# Patient Record
Sex: Female | Born: 1965 | Race: Black or African American | Hispanic: No | Marital: Single | Smoking: Never smoker
Health system: Southern US, Community
[De-identification: ages and names within clinical notes are randomized; demographics above are authoritative.]

## PROBLEM LIST (undated history)

## (undated) DIAGNOSIS — D219 Benign neoplasm of connective and other soft tissue, unspecified: Secondary | ICD-10-CM

## (undated) DIAGNOSIS — E7800 Pure hypercholesterolemia, unspecified: Secondary | ICD-10-CM

## (undated) DIAGNOSIS — Z87442 Personal history of urinary calculi: Secondary | ICD-10-CM

## (undated) DIAGNOSIS — Z8719 Personal history of other diseases of the digestive system: Secondary | ICD-10-CM

## (undated) DIAGNOSIS — E119 Type 2 diabetes mellitus without complications: Secondary | ICD-10-CM

## (undated) DIAGNOSIS — Q632 Ectopic kidney: Secondary | ICD-10-CM

## (undated) DIAGNOSIS — D259 Leiomyoma of uterus, unspecified: Secondary | ICD-10-CM

## (undated) DIAGNOSIS — E785 Hyperlipidemia, unspecified: Secondary | ICD-10-CM

## (undated) DIAGNOSIS — E669 Obesity, unspecified: Secondary | ICD-10-CM

## (undated) DIAGNOSIS — K829 Disease of gallbladder, unspecified: Secondary | ICD-10-CM

## (undated) DIAGNOSIS — N200 Calculus of kidney: Secondary | ICD-10-CM

## (undated) DIAGNOSIS — I10 Essential (primary) hypertension: Secondary | ICD-10-CM

## (undated) DIAGNOSIS — K5730 Diverticulosis of large intestine without perforation or abscess without bleeding: Secondary | ICD-10-CM

## (undated) DIAGNOSIS — D649 Anemia, unspecified: Secondary | ICD-10-CM

## (undated) HISTORY — DX: Disease of gallbladder, unspecified: K82.9

## (undated) HISTORY — DX: Obesity, unspecified: E66.9

## (undated) HISTORY — DX: Benign neoplasm of connective and other soft tissue, unspecified: D21.9

## (undated) HISTORY — DX: Anemia, unspecified: D64.9

## (undated) HISTORY — DX: Pure hypercholesterolemia, unspecified: E78.00

## (undated) HISTORY — DX: Ectopic kidney: Q63.2

## (undated) HISTORY — PX: CHOLECYSTECTOMY: SHX55

## (undated) HISTORY — PX: KNEE SURGERY: SHX244

## (undated) HISTORY — DX: Essential (primary) hypertension: I10

## (undated) HISTORY — DX: Type 2 diabetes mellitus without complications: E11.9

## (undated) HISTORY — PX: HAND SURGERY: SHX662

---

## 1997-12-28 ENCOUNTER — Other Ambulatory Visit: Admission: RE | Admit: 1997-12-28 | Discharge: 1997-12-28 | Payer: Self-pay | Admitting: Gynecology

## 1999-07-25 ENCOUNTER — Other Ambulatory Visit: Admission: RE | Admit: 1999-07-25 | Discharge: 1999-07-25 | Payer: Self-pay | Admitting: Gynecology

## 2000-06-30 ENCOUNTER — Encounter: Admission: RE | Admit: 2000-06-30 | Discharge: 2000-06-30 | Payer: Self-pay | Admitting: *Deleted

## 2000-06-30 ENCOUNTER — Encounter: Payer: Self-pay | Admitting: *Deleted

## 2000-07-29 ENCOUNTER — Encounter (INDEPENDENT_AMBULATORY_CARE_PROVIDER_SITE_OTHER): Payer: Self-pay | Admitting: Specialist

## 2000-07-29 ENCOUNTER — Observation Stay (HOSPITAL_COMMUNITY): Admission: RE | Admit: 2000-07-29 | Discharge: 2000-07-30 | Payer: Self-pay | Admitting: General Surgery

## 2000-08-29 ENCOUNTER — Other Ambulatory Visit: Admission: RE | Admit: 2000-08-29 | Discharge: 2000-08-29 | Payer: Self-pay | Admitting: Gynecology

## 2001-04-04 ENCOUNTER — Encounter: Payer: Self-pay | Admitting: Emergency Medicine

## 2001-04-04 ENCOUNTER — Emergency Department (HOSPITAL_COMMUNITY): Admission: EM | Admit: 2001-04-04 | Discharge: 2001-04-04 | Payer: Self-pay | Admitting: Emergency Medicine

## 2001-05-28 ENCOUNTER — Encounter: Payer: Self-pay | Admitting: Orthopedic Surgery

## 2001-05-28 ENCOUNTER — Ambulatory Visit (HOSPITAL_COMMUNITY): Admission: RE | Admit: 2001-05-28 | Discharge: 2001-05-28 | Payer: Self-pay | Admitting: Orthopedic Surgery

## 2001-08-31 ENCOUNTER — Other Ambulatory Visit: Admission: RE | Admit: 2001-08-31 | Discharge: 2001-08-31 | Payer: Self-pay | Admitting: Gynecology

## 2002-02-17 ENCOUNTER — Ambulatory Visit (HOSPITAL_BASED_OUTPATIENT_CLINIC_OR_DEPARTMENT_OTHER): Admission: RE | Admit: 2002-02-17 | Discharge: 2002-02-17 | Payer: Self-pay | Admitting: Orthopedic Surgery

## 2003-03-02 ENCOUNTER — Other Ambulatory Visit: Admission: RE | Admit: 2003-03-02 | Discharge: 2003-03-02 | Payer: Self-pay | Admitting: Gynecology

## 2003-03-03 ENCOUNTER — Encounter: Admission: RE | Admit: 2003-03-03 | Discharge: 2003-03-03 | Payer: Self-pay | Admitting: Gynecology

## 2004-01-27 ENCOUNTER — Emergency Department (HOSPITAL_COMMUNITY): Admission: EM | Admit: 2004-01-27 | Discharge: 2004-01-27 | Payer: Self-pay | Admitting: Family Medicine

## 2004-03-09 ENCOUNTER — Other Ambulatory Visit: Admission: RE | Admit: 2004-03-09 | Discharge: 2004-03-09 | Payer: Self-pay | Admitting: Gynecology

## 2004-03-28 ENCOUNTER — Encounter: Admission: RE | Admit: 2004-03-28 | Discharge: 2004-03-28 | Payer: Self-pay | Admitting: Gynecology

## 2005-03-14 ENCOUNTER — Other Ambulatory Visit: Admission: RE | Admit: 2005-03-14 | Discharge: 2005-03-14 | Payer: Self-pay | Admitting: Gynecology

## 2005-03-29 ENCOUNTER — Encounter: Admission: RE | Admit: 2005-03-29 | Discharge: 2005-03-29 | Payer: Self-pay | Admitting: Gynecology

## 2006-01-06 HISTORY — PX: INTRAUTERINE DEVICE INSERTION: SHX323

## 2006-03-19 ENCOUNTER — Other Ambulatory Visit: Admission: RE | Admit: 2006-03-19 | Discharge: 2006-03-19 | Payer: Self-pay | Admitting: Gynecology

## 2006-04-01 ENCOUNTER — Encounter: Admission: RE | Admit: 2006-04-01 | Discharge: 2006-04-01 | Payer: Self-pay | Admitting: Gynecology

## 2007-03-23 ENCOUNTER — Other Ambulatory Visit: Admission: RE | Admit: 2007-03-23 | Discharge: 2007-03-23 | Payer: Self-pay | Admitting: Gynecology

## 2008-01-08 ENCOUNTER — Ambulatory Visit (HOSPITAL_COMMUNITY): Admission: RE | Admit: 2008-01-08 | Discharge: 2008-01-08 | Payer: Self-pay | Admitting: Surgery

## 2008-01-13 ENCOUNTER — Ambulatory Visit (HOSPITAL_COMMUNITY): Admission: RE | Admit: 2008-01-13 | Discharge: 2008-01-13 | Payer: Self-pay | Admitting: Surgery

## 2008-01-27 ENCOUNTER — Encounter: Admission: RE | Admit: 2008-01-27 | Discharge: 2008-01-27 | Payer: Self-pay | Admitting: Surgery

## 2008-01-29 ENCOUNTER — Ambulatory Visit: Payer: Self-pay | Admitting: Women's Health

## 2008-03-23 ENCOUNTER — Encounter: Payer: Self-pay | Admitting: Women's Health

## 2008-03-23 ENCOUNTER — Other Ambulatory Visit: Admission: RE | Admit: 2008-03-23 | Discharge: 2008-03-23 | Payer: Self-pay | Admitting: Gynecology

## 2008-03-23 ENCOUNTER — Ambulatory Visit: Payer: Self-pay | Admitting: Women's Health

## 2008-04-07 ENCOUNTER — Ambulatory Visit (HOSPITAL_COMMUNITY): Admission: RE | Admit: 2008-04-07 | Discharge: 2008-04-07 | Payer: Self-pay | Admitting: Surgery

## 2008-10-27 ENCOUNTER — Ambulatory Visit: Payer: Self-pay | Admitting: Women's Health

## 2009-01-11 ENCOUNTER — Ambulatory Visit: Payer: Self-pay | Admitting: Women's Health

## 2009-02-07 ENCOUNTER — Emergency Department (HOSPITAL_COMMUNITY): Admission: EM | Admit: 2009-02-07 | Discharge: 2009-02-07 | Payer: Self-pay | Admitting: Family Medicine

## 2009-03-30 ENCOUNTER — Ambulatory Visit: Payer: Self-pay | Admitting: Women's Health

## 2009-03-30 ENCOUNTER — Other Ambulatory Visit: Admission: RE | Admit: 2009-03-30 | Discharge: 2009-03-30 | Payer: Self-pay | Admitting: Gynecology

## 2009-10-16 ENCOUNTER — Ambulatory Visit: Payer: Self-pay | Admitting: Women's Health

## 2010-02-01 ENCOUNTER — Ambulatory Visit: Payer: Self-pay | Admitting: Women's Health

## 2010-04-04 ENCOUNTER — Other Ambulatory Visit
Admission: RE | Admit: 2010-04-04 | Discharge: 2010-04-04 | Payer: Self-pay | Source: Home / Self Care | Admitting: Gynecology

## 2010-04-04 ENCOUNTER — Ambulatory Visit: Payer: Self-pay | Admitting: Women's Health

## 2010-05-20 ENCOUNTER — Encounter: Payer: Self-pay | Admitting: Obstetrics and Gynecology

## 2010-09-14 NOTE — Op Note (Signed)
[facility]  Patient:    Cassidy Giles, Cassidy Giles                      MRN: [id number] Proc. Date: 07/29/00 Adm. Date:  [id number] Attending:  Carson Myrtle CC:         Roosvelt Harps, M.D.  Luanna Cole. Lenord Fellers, M.D.   Operative Report  PREOPERATIVE DIAGNOSIS:  Chronic cholecystolithiasis.  POSTOPERATIVE DIAGNOSIS:  Chronic cholecystolithiasis.  PROCEDURE:  Laparoscopic cholecystectomy.  SURGEON:  Kendrick Ranch, M.D.  ASSISTANT:  Thornton Dales, M.D.  ANESTHESIA:  CRNA supervised ______.  HISTORY OF PRESENT ILLNESS:  Ms. Halbur is considerably overweight, 34, otherwise healthy. Gallstones on ultrasound, normal liver function studies, and she wishes to proceed with the lap chole as planned.  DESCRIPTION OF PROCEDURE:  The patient brought to the operating room, placed supine, general endotracheal anesthesia administered. A Foley catheter inserted, the abdomen scrubbed, prepped and draped in the usual fashion. Marcaine 0.5% with epinephrine was used throughout prior to each puncture. A vertical infraumbilical incision made, the fascia identified, opened vertically, the peritoneum entered without complication. A Hasson catheter placed, abdomen insufflated, camera introduced, general peritoneoscopy was fairly unremarkable. The uterus appeared fibroid. The gallbladder appeared chronically inflamed with multiple stones. A second 10 mm trocar placed in the mid epigastrium, two 5 mm trocars in the right upper quadrant. Appropriate instruments applied, the gallbladder grasped, placed on tension, dissection at the base of the gallbladder revealed a normal cystic duct entering the gallbladder. This was dissected free, triply clipped and divided. The artery behind it was triply clipped and divided and the gallbladder was removed from the gallbladder bed without incident or complication. There was no spillage. No bowel or blood. The bed was dry, irrigant was clear.  The gallbladder was removed from the abdomen through the infraumbilical incision by crushing the stones and removing the stones and gallbladder. That incision was tied closed with a #1 Vicryl. Irrigation carried out and was again clear. All irrigant, CO2, instruments and trocars removed. The skin incisions were closed with 3-0 monocryl and Steri-Strips were applied. Second and first counts were correct. She tolerated it well and was awakened and taken to the recovery room. DD:  07/29/00 TD:  07/29/00 Job: [id number]

## 2010-09-14 NOTE — Op Note (Signed)
NAME:  Cassidy Giles, WAMBOLT NO.:  [id number]   MEDICAL RECORD NO.:  [id number]                   PATIENT TYPE:  AMB   LOCATION:  DSC                                  FACILITY:  [facility]   PHYSICIAN:  Mila Homer. Sherlean Foot, M.D.              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   PROCEDURE:  Right knee arthroscopy with saucerization of the lateral discoid  meniscus and partial lateral meniscectomy as well as plica debridement in  the patellofemoral joint.   SURGEON:  Mila Homer. Sherlean Foot, M.D.   ANESTHESIA:  General.   INDICATIONS FOR PROCEDURE:  The patient is a 45 year old black female with  mechanical symptoms in her knee.  Informed consent was obtained.   DESCRIPTION OF PROCEDURE:  The patient was laid supine, administered general  LMA anesthesia.  The right lower extremity was prepped and draped in the  usual sterile fashion.  Inferolateral and inferomedial portals were created  with a #11 blade, blunt trocar, and cannula.  Diagnostic arthroscopy  revealed a large patellofemoral plica which was impinging between the  patella and femur.  There was no chondromalacia in the patellofemoral  compartment.  There was just a very small area of grade 3-4 chondromalacia  on the medial femoral condyle, otherwise the medial compartment was normal.  The ACL and PCL were normal.  Lateral compartment showed a peripheral  discoid meniscus.  I put a basket forceps into the inferomedial compartment  and performed a saucerization of the lateral meniscus.  The tear, however,  was radial and went all the way from the posterior horn to the anterior horn  and involved the entire popliteus hiatus area.  I,  therefore, had to remove  the entire meniscus, except for the far posterior horn and anterior horn  which was anterior to the popliteus hiatus.  The lateral compartment had no  chondromalacia.  I used a [employer] to remove all of the meniscus  debris, and then took one further tour.  Finally, I went into extension and  debrided the patellofemoral plica with the [employer] shaver and Arthro-  Care cautery wand.  I then lavaged the knee joint, closed with interrupted 4-  0 nylon sutures, infiltrated with 10 cc of a Marcaine/morphine mixture in  both portals, and dressed with Xeroform dressing sponges, sterile Webril,  and Ace wrap.  The patient tolerated the procedure well.   COMPLICATIONS:  None.   DRAINS:  None.   TOURNIQUET:  None.  Mila Homer. Sherlean Foot, M.D.   SDL/MEDQ  D:  02/17/2002  T:  02/17/2002  Job:  [id number]

## 2010-11-27 DIAGNOSIS — I10 Essential (primary) hypertension: Secondary | ICD-10-CM | POA: Insufficient documentation

## 2011-01-07 ENCOUNTER — Telehealth: Payer: Self-pay | Admitting: *Deleted

## 2011-01-07 ENCOUNTER — Other Ambulatory Visit: Payer: Self-pay | Admitting: *Deleted

## 2011-01-07 DIAGNOSIS — Z3049 Encounter for surveillance of other contraceptives: Secondary | ICD-10-CM

## 2011-01-07 MED ORDER — LEVONORGESTREL 20 MCG/24HR IU IUD: INTRAUTERINE_SYSTEM | Freq: Once | INTRAUTERINE | Status: DC

## 2011-01-07 NOTE — Telephone Encounter (Signed)
Patient informed Mirena IUD benefits.  Patient would have a $30 copay.  Informed would need TF to insert. Patient will call back to schedule.

## 2011-02-26 ENCOUNTER — Encounter: Payer: Self-pay | Admitting: *Deleted

## 2011-02-26 ENCOUNTER — Other Ambulatory Visit: Payer: Self-pay | Admitting: *Deleted

## 2011-02-26 NOTE — Progress Notes (Signed)
  Patient states that she wants to make sure that when she has IUD put in there is a numbing med used on cervix. Will have a $30 copay to remove and insert.

## 2011-03-11 ENCOUNTER — Encounter: Payer: Self-pay | Admitting: Gynecology

## 2011-03-11 ENCOUNTER — Ambulatory Visit (INDEPENDENT_AMBULATORY_CARE_PROVIDER_SITE_OTHER): Payer: BC Managed Care – PPO | Admitting: Gynecology

## 2011-03-11 ENCOUNTER — Encounter: Payer: Self-pay | Admitting: Anesthesiology

## 2011-03-11 VITALS — BP 138/88

## 2011-03-11 DIAGNOSIS — D219 Benign neoplasm of connective and other soft tissue, unspecified: Secondary | ICD-10-CM

## 2011-03-11 DIAGNOSIS — IMO0001 Reserved for inherently not codable concepts without codable children: Secondary | ICD-10-CM

## 2011-03-11 DIAGNOSIS — Z975 Presence of (intrauterine) contraceptive device: Secondary | ICD-10-CM

## 2011-03-11 DIAGNOSIS — D259 Leiomyoma of uterus, unspecified: Secondary | ICD-10-CM

## 2011-03-11 DIAGNOSIS — Z3049 Encounter for surveillance of other contraceptives: Secondary | ICD-10-CM

## 2011-03-11 NOTE — Progress Notes (Signed)
45 year old who presented to the office today to remove her expired Mirena IUD that was placed in September 2007 with replacement of a new Mirena IUD. Patient has done well with this form of contraception she is fully aware this 99% effective and is good for 5 years. Review of her record indicated she did for annual exam next month. Also it was noted that an ultrasound done in the office 01/11/2009 indicated she has a uterus that measured 11 x 4.8 x 3.9 cm with a fundal pedunculated fibroid measured 45 x 33 mm and several small fibroids as well. Consent form was signed.  Procedure: The vagina was cleansed with Betadine solution but prior to this a bimanual examination demonstrated an anteverted uterus irregularity at the fundus was noted as a result of the fundal fibroid. A single-tooth tenaculum and then placed on the anterior cervical lip the cervix required some dilatation and the uterus sounded to 7 cm. Prior to this field IUD was removed shown to the patient and discarded. The new sterile IUD was placed without any complications. The string was trimmed.  Plan: Patient will return back in one month for overdue annual exam will take the opportunity doing an ultrasound to followup on her fibroid uterus and the same time to confirm that the IUD is in the right position as well. Patient was given an Aleve after the procedure tolerated well without complication we'll follow accordingly.

## 2011-04-04 ENCOUNTER — Encounter: Payer: Self-pay | Admitting: Women's Health

## 2011-04-08 ENCOUNTER — Other Ambulatory Visit (HOSPITAL_COMMUNITY)
Admission: RE | Admit: 2011-04-08 | Discharge: 2011-04-08 | Disposition: A | Discharge status: Home / Self Care | Payer: BC Managed Care – PPO | Source: Ambulatory Visit | Attending: Gynecology | Admitting: Gynecology

## 2011-04-08 ENCOUNTER — Ambulatory Visit (INDEPENDENT_AMBULATORY_CARE_PROVIDER_SITE_OTHER): Payer: BC Managed Care – PPO

## 2011-04-08 ENCOUNTER — Ambulatory Visit (INDEPENDENT_AMBULATORY_CARE_PROVIDER_SITE_OTHER): Payer: BC Managed Care – PPO | Admitting: Gynecology

## 2011-04-08 ENCOUNTER — Encounter: Payer: Self-pay | Admitting: Gynecology

## 2011-04-08 ENCOUNTER — Encounter: Payer: Self-pay | Admitting: Women's Health

## 2011-04-08 ENCOUNTER — Other Ambulatory Visit: Payer: Self-pay | Admitting: Gynecology

## 2011-04-08 VITALS — BP 136/88 | Ht 63.25 in | Wt 233.0 lb

## 2011-04-08 DIAGNOSIS — Z01419 Encounter for gynecological examination (general) (routine) without abnormal findings: Secondary | ICD-10-CM | POA: Insufficient documentation

## 2011-04-08 DIAGNOSIS — N83209 Unspecified ovarian cyst, unspecified side: Secondary | ICD-10-CM

## 2011-04-08 DIAGNOSIS — Z803 Family history of malignant neoplasm of breast: Secondary | ICD-10-CM

## 2011-04-08 DIAGNOSIS — D219 Benign neoplasm of connective and other soft tissue, unspecified: Secondary | ICD-10-CM

## 2011-04-08 DIAGNOSIS — N831 Corpus luteum cyst of ovary, unspecified side: Secondary | ICD-10-CM

## 2011-04-08 DIAGNOSIS — IMO0001 Reserved for inherently not codable concepts without codable children: Secondary | ICD-10-CM

## 2011-04-08 DIAGNOSIS — R1904 Left lower quadrant abdominal swelling, mass and lump: Secondary | ICD-10-CM

## 2011-04-08 DIAGNOSIS — R635 Abnormal weight gain: Secondary | ICD-10-CM

## 2011-04-08 DIAGNOSIS — E1169 Type 2 diabetes mellitus with other specified complication: Secondary | ICD-10-CM | POA: Insufficient documentation

## 2011-04-08 DIAGNOSIS — R82998 Other abnormal findings in urine: Secondary | ICD-10-CM

## 2011-04-08 DIAGNOSIS — Q638 Other specified congenital malformations of kidney: Secondary | ICD-10-CM

## 2011-04-08 DIAGNOSIS — D259 Leiomyoma of uterus, unspecified: Secondary | ICD-10-CM

## 2011-04-08 DIAGNOSIS — Z975 Presence of (intrauterine) contraceptive device: Secondary | ICD-10-CM

## 2011-04-08 DIAGNOSIS — B373 Candidiasis of vulva and vagina: Secondary | ICD-10-CM

## 2011-04-08 DIAGNOSIS — E119 Type 2 diabetes mellitus without complications: Secondary | ICD-10-CM

## 2011-04-08 DIAGNOSIS — B3731 Acute candidiasis of vulva and vagina: Secondary | ICD-10-CM

## 2011-04-08 DIAGNOSIS — Q632 Ectopic kidney: Secondary | ICD-10-CM | POA: Insufficient documentation

## 2011-04-08 MED ORDER — FLUCONAZOLE 150 MG PO TABS: 150.0000 mg | ORAL_TABLET | Freq: Once | ORAL | Status: AC

## 2011-04-08 NOTE — Patient Instructions (Signed)
Patient information: Uterine artery embolization (The Basics)Written by the doctors and editors at UpToDate  What is uterine artery embolization? - Uterine artery embolization (also called "Colombia") is a treatment for fibroids. Fibroids are abnormal growths of tissue that form in the uterus, the part of the woman's body that holds the baby if she is pregnant.  Colombia is also called "uterine fibroid embolization." It works by blocking the blood vessels that supply blood to fibroids, causing them to shrink. It is 1 of several ways doctors treat fibroids.  Why might a woman get Colombia? - Colombia helps ease the heavy bleeding and lower belly pressure caused by fibroids. Compared with surgery to treat fibroids, Colombia: Takes less time  Does not require any cuts (incisions) in the belly  Has a shorter recovery time What are the downsides of Colombia? - Compared with women treated with surgery, women treated with Colombia are more likely to need a second surgery to control heavy bleeding.  Colombia does not work well on certain types of fibroids. Also, women who want to get pregnant in the future should not have Colombia. Your doctor can help you decide if Colombia is right for you.  What happens during Colombia? - If you are having Colombia, you will be treated in a hospital. You will get medicines (called "anesthesia") to block pain in the lower half of your body and to make you feel relaxed. But you will stay awake.  The doctor will then insert a thin tube (called a "catheter") into the artery at the top of 1 leg and advance it up to your uterus. With the tube in place, the doctor will inject a special dye that can be seen on X-rays to show the arteries that bring blood to the fibroid. Next, the doctor will inject tiny plastic beads into the artery to block the blood flow to the fibroid. Afterward, the doctor will repeat the same steps on the other side. The procedure takes about 1 hour. You will likely stay in the hospital overnight, where nurses can check  you and give you pain medicines.  What happens after Colombia? - Most women have lower belly pain and cramping after the procedure. These symptoms are usually worst the first 2 days after the procedure and can last up to 7 days. Some women have discharge or bleeding from the vagina for 2 weeks after Colombia. Sometimes, the bleeding lasts for months.  Some women also:  Feel sick to their stomach (nausea)  Throw up (vomit)  Have a fever  Feel tired and achy If you have these symptoms, your doctor can prescribe pain relievers and other medicines to treat them.  You should see your doctor for several follow-up visits during the first year to check that the procedure worked.  What if I want to get pregnant? - It is possible to get pregnant after Colombia. But after Colombia, pregnancy might not be safe for either the mother or the baby. Because of this, doctors advise women who want to get pregnant NOT to have this procedure. They also advise women to use birth control after the procedure to avoid pregnancy.

## 2011-04-08 NOTE — Progress Notes (Signed)
Cassidy Giles [birth year] [id number]   History:    45 y.o.  gravida 0 para 0 presented to the office today for her annual gynecological examination. Patient had a Mirena IUD placed in November 2011 and has had no problems. She frequently does her self breast examination. Her sister was recently diagnosed breast cancer. Patient's mammogram recently done this year which was normal. Patient has had a past history of type 2 diabetes currently on no medication.  Past medical history,surgical history, family history and social history were all reviewed and documented in the EPIC chart.  Gynecologic History Patient's last menstrual period was 02/25/2011. Contraception: IUD Last Pap: 2011. Results were:normal} Last mammogram: 2012. Results were: normal  Obstetric History OB History    Grav Para Term Preterm Abortions TAB SAB Ect Mult Living   0                ROS:  Was performed and pertinent positives and negatives are included in the history.  Exam: chaperone present  BP 136/88  Ht 5' 3.25" (1.607 m)  Wt 233 lb (105.688 kg)  BMI 40.95 kg/m2  LMP 02/25/2011  Body mass index is 40.95 kg/(m^2).  General appearance : Well developed well nourished female. No acute distress HEENT: Neck supple, trachea midline, no carotid bruits, no thyroidmegaly Lungs: Clear to auscultation, no rhonchi or wheezes, or rib retractions  Heart: Regular rate and rhythm, no murmurs or gallops Breast:Examined in sitting and supine position were symmetrical in appearance, no palpable masses or tenderness,  no skin retraction, no nipple inversion, no nipple discharge, no skin discoloration, no axillary or supraclavicular lymphadenopathy Abdomen: no palpable masses or tenderness, no rebound or guarding Extremities: no edema or skin discoloration or tenderness  Pelvic:  Bartholin, Urethra, Skene Glands: Within normal limits             Vagina: No gross lesions or discharge  Cervix: No gross lesions or discharge  IUD string seen  Uterus  anteverted slightly irregular at the fundus, normal size, shape and consistency, non-tender and mobile  Adnexa  Without masses or tenderness  Anus and perineum  normal   Rectovaginal  normal sphincter tone without palpated masses or tenderness             Hemoccult not done  Patient overweight with a weight of 233 pounds and a BMI of 40.95. Ultrasound was done today for followup on her fibroid uterus. Ultrasound demonstrated uterus measured 8.6 x 5.2 x 3.9 cm endometrial stripe 0.9 mm a subserosal myoma measuring 5.2 x 5.1 x 4.3 cm was noted IUD was seen in the intrauterine cavity. 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    Assessment/Plan:  45 y.o. female for annual exam with the above ultrasound findings. Patient sometimes having some: Served fullness sensation the by Vernona Rieger she's been any major problems. We discussed this the cyst fibroid was transmural it to give her problem we can plan on doing a hysterectomy since she is 45 years of age area she is not interested in hysterectomy. He did discuss uterine artery embolization and literature formation was provided and we'll refer her to the interventional radiologist for further evaluation and assessment and she would like to proceed that route. During her examination she was noted to have a white thick vaginal  discharge for was a wet prep was done which demonstrated moniliasis and she was given a prescription of Diflucan 150 mg to take today. The following labs were drawn: Fasting blood sugar, fasting lipid profile, CBC, TSH, CBC, urinalysis and Pap smear. We discussed about importance of her checking with her sister and her sister's oncologist to see if she was tested for the BRCA one BRCA2 gene. I've asked to return  back in 3 months for followup ultrasound to make sure the cyst is resolved.   Ok Edwards MD, 1:58 PM 04/08/2011

## 2011-04-09 ENCOUNTER — Other Ambulatory Visit: Payer: Self-pay | Admitting: Gynecology

## 2011-04-09 LAB — GLUCOSE, RANDOM: Glucose, Bld: 115 mg/dL — ABNORMAL HIGH (ref 70–99)

## 2011-04-10 ENCOUNTER — Other Ambulatory Visit: Payer: Self-pay | Admitting: *Deleted

## 2011-04-10 DIAGNOSIS — D219 Benign neoplasm of connective and other soft tissue, unspecified: Secondary | ICD-10-CM

## 2011-05-07 ENCOUNTER — Telehealth: Payer: Self-pay | Admitting: *Deleted

## 2011-05-07 NOTE — Telephone Encounter (Signed)
Message copied by Libby Maw on Tue May 07, 2011 10:48 AM ------      Message from: Reynaldo Minium H      Created: Mon Apr 08, 2011  2:06 PM       Cassidy Giles, this patient is interested in uterine artery embolization for fibroid uterus. Please make arrangements for her with the interventional radiologist here in [place]. Please make them aware that she does have an IUD in place. Patient expecting your call at a later time thank you.

## 2011-05-07 NOTE — Telephone Encounter (Signed)
appt set up for 05/21/11 @7am  with Dr. Deanne Coffer.  Patient was contacted by their office.

## 2011-05-21 ENCOUNTER — Ambulatory Visit
Admission: RE | Admit: 2011-05-21 | Discharge: 2011-05-21 | Disposition: A | Discharge status: Home / Self Care | Payer: BC Managed Care – PPO | Source: Ambulatory Visit | Attending: Gynecology | Admitting: Gynecology

## 2011-05-21 ENCOUNTER — Other Ambulatory Visit: Payer: Self-pay | Admitting: Gynecology

## 2011-05-21 DIAGNOSIS — D219 Benign neoplasm of connective and other soft tissue, unspecified: Secondary | ICD-10-CM

## 2011-05-29 ENCOUNTER — Telehealth: Payer: Self-pay | Admitting: *Deleted

## 2011-05-29 NOTE — Telephone Encounter (Signed)
Lm for Neysa Bonito to let her know precert approved for MRI #[id number] till Jun 27, 2011.

## 2011-06-03 ENCOUNTER — Other Ambulatory Visit: Payer: BC Managed Care – PPO

## 2011-07-08 ENCOUNTER — Ambulatory Visit: Payer: BC Managed Care – PPO | Admitting: Gynecology

## 2011-07-08 ENCOUNTER — Other Ambulatory Visit: Payer: BC Managed Care – PPO

## 2011-12-06 ENCOUNTER — Ambulatory Visit (INDEPENDENT_AMBULATORY_CARE_PROVIDER_SITE_OTHER): Payer: BC Managed Care – PPO | Admitting: Women's Health

## 2011-12-06 ENCOUNTER — Encounter: Payer: Self-pay | Admitting: Women's Health

## 2011-12-06 DIAGNOSIS — Z833 Family history of diabetes mellitus: Secondary | ICD-10-CM

## 2011-12-06 DIAGNOSIS — Z30431 Encounter for routine checking of intrauterine contraceptive device: Secondary | ICD-10-CM

## 2011-12-06 DIAGNOSIS — Z975 Presence of (intrauterine) contraceptive device: Secondary | ICD-10-CM

## 2011-12-06 DIAGNOSIS — IMO0001 Reserved for inherently not codable concepts without codable children: Secondary | ICD-10-CM

## 2011-12-06 LAB — HEMOGLOBIN A1C
Hgb A1c MFr Bld: 6.3 % — ABNORMAL HIGH (ref <5.7)
Mean Plasma Glucose: 134 mg/dL — ABNORMAL HIGH (ref <117)

## 2011-12-06 NOTE — Progress Notes (Signed)
Patient ID: Cassidy Giles, female   DOB: 27-Jul-1965, 46 y.o.   MRN: [id number] Presents to have Mirena IUD removed. Placed November 2012, has had spotting/bleeding most days of the month with increased discharge, abdominal cramping and increased dysmenorrhea with cycle. History of a 5 cm fibroid. Not sexually active for greater than a year. Denies any vaginal discharge with itching or odor at this time. Has had an elevated glucose in the past, had seen Dr. in [facility] in the past, on no meds, but she has moved. Glucose was 112 in December 2012.  Exam: External genitalia within normal limits, speculum exam cervix is pink IUD strings visible, IUD removed intact shown to patient and discarded.  Fibroids Elevated blood sugars  Plan: Condoms encouraged if become sexually active, reviewed hysterectomy. Is 46 reviewed menopause. Will call if dysmenorrhea increases with no relief with Motrin. Reviewed importance of increasing exercise, decreasing calories for weight loss. Hemoglobin A1c pending. Reviewed if elevated will refer to endocrinologist.

## 2011-12-06 NOTE — Patient Instructions (Addendum)

## 2011-12-09 ENCOUNTER — Telehealth: Payer: Self-pay | Admitting: *Deleted

## 2011-12-09 NOTE — Telephone Encounter (Signed)
Office note faxed to [facility] office, they will contact office with time and date.  [phone] contact #

## 2011-12-09 NOTE — Telephone Encounter (Signed)
Pt informed with the below note. 

## 2011-12-09 NOTE — Telephone Encounter (Signed)
Pt appointment set at [facility] @ 01/14/12 @ 9:00am. Left message for pt to call.

## 2012-02-19 ENCOUNTER — Ambulatory Visit (INDEPENDENT_AMBULATORY_CARE_PROVIDER_SITE_OTHER): Payer: BC Managed Care – PPO | Admitting: Women's Health

## 2012-02-19 ENCOUNTER — Encounter: Payer: Self-pay | Admitting: Women's Health

## 2012-02-19 DIAGNOSIS — B373 Candidiasis of vulva and vagina: Secondary | ICD-10-CM

## 2012-02-19 DIAGNOSIS — Z23 Encounter for immunization: Secondary | ICD-10-CM

## 2012-02-19 LAB — WET PREP FOR TRICH, YEAST, CLUE: Trich, Wet Prep: NONE SEEN

## 2012-02-19 MED ORDER — FLUCONAZOLE 150 MG PO TABS: 150.0000 mg | ORAL_TABLET | Freq: Once | ORAL | Status: DC

## 2012-02-19 NOTE — Patient Instructions (Addendum)
Bartholin's Cyst and Abscess  Bartholin's glands produce mucus through small openings just outside the opening of the vagina. The mucus helps with lubrication around the vagina during sexual intercourse. If the duct becomes clogged, the gland will swell and cause a bulge on the inside of the vagina. If this becomes big enough, it can be seen and felt on the outside of the vagina as well. Sometimes, the swelling will shrink away by itself. However, if the cyst becomes infected, the Bartholin's cyst fills with pus and becomes more swollen, red and painful and becomes a Bartholin's abscess. This usually requires antibiotic treatment and surgical drainage. Sometimes, with minor surgery under local anesthesia, a small tube is placed in the cyst or abscess wall. This allows continued drainage for up to 6 weeks. Minor surgery can make a new opening to replace the clogged duct and help prevent future cysts or abscess.  If the abscess occurs several times, a minor operation with local anesthesia is necessary to remove the Bartholin's gland completely or to make it drain better. Cutting open the gland and suturing the edges to make the opening of the gland bigger (marsupialization) may be needed and should usually be done by your obstetrician-gyncology physician. Antibiotics are usually prescribed for this condition. Take all antibiotics as prescribed. Make sure to finish them even if you are doing better. Take warm sitz baths for 20 minutes, 3 times a day. See your caregiver for follow-up care as recommended.  SEEK MEDICAL CARE IF:   · You have increasing pain, swelling, or redness near the vagina.  · You have vomiting or inability to tolerate medicines.  · You have a fever.  · You have uncontrolled bleeding from the vagina.  Document Released: 05/23/2004 Document Revised: 07/08/2011 Document Reviewed: 05/26/2009  ExitCare® Patient Information ©2013 [employer].

## 2012-02-19 NOTE — Progress Notes (Signed)
Patient ID: Cassidy Giles, female   DOB: [birth year], 46 y.o.   MRN: [id number] Presents with complaint of vaginal itching and bulging nontender knot at left-side of vagina. Has had in the past that resolved on its own. Had been on antibiotic for upper respiratory and sinus infection 3 weeks ago. Not sexually active. Denies any urinary symptoms.  Exam: External genitalia 3 cm left Bartholin cyst nontender, soft, non indurated. Speculum exam scant discharge, vaginal walls slightly erythematous, wet prep positive for yeast. Bimanual no CMT or adnexal fullness or tenderness.  Yeast Left Bartholin  Cyst.   Plan: Diflucan 150 by mouth x1 dose, prescription proper use given. Did review most likely from antibiotics. Reviewed importance of wearing loose clothes, instructed to call if Bartholin cyst becomes painful, or does not resolve.

## 2012-02-19 NOTE — Addendum Note (Signed)
Addended by: Harrington Challenger on: 02/19/2012 04:24 PM   Modules accepted: Orders

## 2012-02-19 NOTE — Addendum Note (Signed)
Addended by: Harrington Challenger on: 02/19/2012 05:31 PM   Modules accepted: Level of Service

## 2012-02-20 DIAGNOSIS — Z23 Encounter for immunization: Secondary | ICD-10-CM

## 2012-02-20 NOTE — Addendum Note (Signed)
Addended by: Leonard Schwartz A on: 02/20/2012 11:02 AM   Modules accepted: Orders

## 2012-04-09 ENCOUNTER — Encounter: Payer: Self-pay | Admitting: Women's Health

## 2012-04-09 ENCOUNTER — Ambulatory Visit (INDEPENDENT_AMBULATORY_CARE_PROVIDER_SITE_OTHER): Payer: BC Managed Care – PPO | Admitting: Women's Health

## 2012-04-09 VITALS — BP 138/80 | Ht 63.5 in | Wt 244.0 lb

## 2012-04-09 DIAGNOSIS — Z01419 Encounter for gynecological examination (general) (routine) without abnormal findings: Secondary | ICD-10-CM

## 2012-04-09 DIAGNOSIS — B373 Candidiasis of vulva and vagina: Secondary | ICD-10-CM

## 2012-04-09 DIAGNOSIS — J45909 Unspecified asthma, uncomplicated: Secondary | ICD-10-CM

## 2012-04-09 DIAGNOSIS — Z803 Family history of malignant neoplasm of breast: Secondary | ICD-10-CM

## 2012-04-09 LAB — WET PREP FOR TRICH, YEAST, CLUE

## 2012-04-09 MED ORDER — FLUCONAZOLE 150 MG PO TABS: 150.0000 mg | ORAL_TABLET | Freq: Once | ORAL | Status: DC

## 2012-04-09 NOTE — Patient Instructions (Addendum)
1500 Calorie Diabetic Diet The 1500 calorie diabetic diet limits calories to 1500 each day. Following this diet and making healthy meal choices can help improve overall health. It controls blood glucose (sugar) levels and can also help lower blood pressure and cholesterol.  SERVING SIZES Measuring foods and serving sizes helps to make sure you are getting the right amount of food. The list below tells how big or small some common serving sizes are.  1 oz.........4 stacked dice.  3 oz.........Deck of cards.  1 tsp........Tip of little finger.  1 tbs........Thumb.  2 tbs........Golf ball.   cup.......Half of a fist.  1 cup........A fist. GUIDELINES FOR CHOOSING FOODS The goal of this diet is to eat a variety of foods and limit calories to 1500 each day. This can be done by choosing foods that are low in calories and fat. The diet also suggests eating small amounts of food frequently. Doing this helps control your blood glucose levels, so they do not get too high or too low. Each meal or snack may include a protein food source to help you feel more satisfied. Try to eat about the same amount of food around the same time each day. This includes weekend days, travel days, and days off work. Space your meals about 4 to 5 hours apart, and add a snack between them, if you wish.  For example, a daily food plan could include breakfast, a morning snack, lunch, dinner, and an evening snack. Healthy meals and snacks have different types of foods, including whole grains, vegetables, fruits, lean meats, poultry, fish, and dairy products. As you plan your meals, select a variety of foods. Choose from the bread and starch, vegetable, fruit, dairy, and meat/protein groups. Examples of foods from each group are listed below, with their suggested serving sizes. Use measuring cups and spoons to become familiar with what a healthy portion looks like. Bread and Starch Each serving equals 15 grams of  carbohydrate.  1 slice bread.   bagel.   cup cold cereal (unsweetened).   cup hot cereal or mashed potatoes.  1 small potato (size of a computer mouse).   cup cooked pasta or rice.   English muffin.  1 cup broth-based soup.  3 cups of popcorn.  4 to 6 whole-wheat crackers.   cup cooked beans, peas, or corn. Vegetables Each serving equals 5 grams of carbohydrate.   cup cooked vegetables.  1 cup raw vegetables.   cup tomato or vegetable juice. Fruit Each serving equals 15 grams of carbohydrate.  1 small apple or orange.  1  cup watermelon or strawberries.   cup applesauce (no sugar added).  2 tbs raisins.   banana.   cup canned fruit, packed in water or in its own juice.   cup unsweetened fruit juice. Dairy Each serving equals 12 to 15 grams of carbohydrate.  1 cup fat-free milk.  6 oz artificially sweetened yogurt or plain yogurt.  1 cup low-fat buttermilk.  1 cup soy milk.  1 cup almond milk. Meat/Protein  1 large egg.  2 to 3 oz meat, poultry, or fish.   cup low-fat cottage cheese.  1 tbs peanut butter.  1 oz low-fat cheese.   cup tuna, packed in water.   cup tofu. Fat  1 tsp oil.  1 tsp trans-fat-free margarine.  1 tsp butter.  1 tsp mayonnaise.  2 tbs avocado.  1 tbs salad dressing.  1 tbs cream cheese.  2 tbs sour cream. SAMPLE 1500 CALORIE DIET   PLAN Breakfast   whole-wheat English muffin (1 carb serving).  1 tsp trans-fat-free margarine.  1 scrambled egg.  1 cup fat-free milk (1 carb serving).  1 small orange (1 carb serving). Lunch  Chicken wrap.  1 whole-wheat tortilla, 8-inch (1 carb servings).  2 oz chicken breast, sliced.  2 tbs low-fat salad dressing, such as [place].   cup shredded lettuce.  2 slices tomato.   cup carrot sticks.  1 small apple (1 carb serving). Afternoon Snack  3 graham cracker squares (1 carb serving).  1 tbs peanut butter. Dinner  2 oz lean  pork chop, broiled.  1 cup brown rice (3 carb servings).   cup steamed carrots.   cup green beans.  1 cup fat-free milk (1 carb serving).  1 tsp trans-fat-free margarine. Evening Snack   cup low-fat cottage cheese.  1 small peach or pear, sliced (or  cup canned in water) (1 carb serving). MEAL PLAN You can use this worksheet to help you make a daily meal plan based on the 1500 calorie diabetic diet suggestions. If you are using this plan to help you control your blood glucose, you may interchange carbohydrate containing foods (dairy, starches, and fruits). Select a variety of fresh foods of varying colors and flavors. The total amount of carbohydrate in your meals or snacks is more important than making sure you include all of the food groups every time you eat. You can choose from approximately this many of the following foods to build your day's meals:  6 Starches.  3 Vegetables.  2 Fruits.  2 Dairy.  4 to 6 oz Meat/Protein.  Up to 3 Fats. Your dietician can use this worksheet to help you decide how many servings and which types of foods are right for you. BREAKFAST Food Group and Servings / Food Choice Starch _________________________________________________________ Dairy __________________________________________________________ Fruit ___________________________________________________________ Meat/Protein____________________________________________________ Fat ____________________________________________________________ LUNCH Food Group and Servings / Food Choice  Starch _________________________________________________________ Meat/Protein ___________________________________________________ Vegetables _____________________________________________________ Fruit __________________________________________________________ Dairy __________________________________________________________ Fat ____________________________________________________________ Cassidy Giles Food Group and Servings / Food Choice Dairy __________________________________________________________ Starch _________________________________________________________ Meat/Protein____________________________________________________ Cassidy Giles ___________________________________________________________ Cassidy Giles Food Group and Servings / Food Choice Starch _________________________________________________________ Meat/Protein ___________________________________________________ Dairy __________________________________________________________ Vegetable ______________________________________________________ Fruit ___________________________________________________________ Fat ____________________________________________________________ Cassidy Giles Food Group and Servings / Food Choice Fruit ___________________________________________________________ Meat/Protein ____________________________________________________ Dairy __________________________________________________________ Starch __________________________________________________________ DAILY TOTALS Starches _________________________ Vegetables _______________________ Fruits ____________________________ Dairy ____________________________ Meat/Protein_____________________ Fats _____________________________ Document Released: 11/05/2004 Document Revised: 07/08/2011 Document Reviewed: 03/02/2009 [employer] Patient Information 2013 [place], [place]. Health Maintenance, Females A healthy lifestyle and preventative care can promote health and wellness.  Maintain regular health, dental, and eye exams.  Eat a healthy diet. Foods like vegetables, fruits, whole grains, low-fat dairy products, and lean protein foods contain the nutrients you need without too many calories. Decrease your intake of foods high in solid fats, added sugars, and salt. Get information about a proper diet from your caregiver, if necessary.  Regular physical exercise  is one of the most important things you can do for your health. Most adults should get at least 150 minutes of moderate-intensity exercise (any activity that increases your heart rate and causes you to sweat) each week. In addition, most adults need muscle-strengthening exercises on 2 or more days a week.   Maintain a healthy weight. The body mass index (BMI) is a screening tool to identify possible weight problems. It provides an estimate of body fat based on height and weight. Your caregiver can help determine your BMI, and can help you achieve  or maintain a healthy weight. For adults 20 years and older:  A BMI below 18.5 is considered underweight.  A BMI of 18.5 to 24.9 is normal.  A BMI of 25 to 29.9 is considered overweight.  A BMI of 30 and above is considered obese.  Maintain normal blood lipids and cholesterol by exercising and minimizing your intake of saturated fat. Eat a balanced diet with plenty of fruits and vegetables. Blood tests for lipids and cholesterol should begin at age 75 and be repeated every 5 years. If your lipid or cholesterol levels are high, you are over 50, or you are a high risk for heart disease, you may need your cholesterol levels checked more frequently.Ongoing high lipid and cholesterol levels should be treated with medicines if diet and exercise are not effective.  If you smoke, find out from your caregiver how to quit. If you do not use tobacco, do not start.  If you are pregnant, do not drink alcohol. If you are breastfeeding, be very cautious about drinking alcohol. If you are not pregnant and choose to drink alcohol, do not exceed 1 drink per day. One drink is considered to be 12 ounces (355 mL) of beer, 5 ounces (148 mL) of wine, or 1.5 ounces (44 mL) of liquor.  Avoid use of street drugs. Do not share needles with anyone. Ask for help if you need support or instructions about stopping the use of drugs.  High blood pressure causes heart disease and  increases the risk of stroke. Blood pressure should be checked at least every 1 to 2 years. Ongoing high blood pressure should be treated with medicines, if weight loss and exercise are not effective.  If you are 82 to 48 years old, ask your caregiver if you should take aspirin to prevent strokes.  Diabetes screening involves taking a blood sample to check your fasting blood sugar level. This should be done once every 3 years, after age 69, if you are within normal weight and without risk factors for diabetes. Testing should be considered at a younger age or be carried out more frequently if you are overweight and have at least 1 risk factor for diabetes.  Breast cancer screening is essential preventative care for women. You should practice "breast self-awareness." This means understanding the normal appearance and feel of your breasts and may include breast self-examination. Any changes detected, no matter how small, should be reported to a caregiver. Women in their 65s and 30s should have a clinical breast exam (CBE) by a caregiver as part of a regular health exam every 1 to 3 years. After age 36, women should have a CBE every year. Starting at age 61, women should consider having a mammogram (breast X-ray) every year. Women who have a family history of breast cancer should talk to their caregiver about genetic screening. Women at a high risk of breast cancer should talk to their caregiver about having an MRI and a mammogram every year.  The Pap test is a screening test for cervical cancer. Women should have a Pap test starting at age 22. Between ages 34 and 35, Pap tests should be repeated every 2 years. Beginning at age 68, you should have a Pap test every 3 years as long as the past 3 Pap tests have been normal. If you had a hysterectomy for a problem that was not cancer or a condition that could lead to cancer, then you no longer need Pap tests. If you are between ages  65 and 70, and you have had  normal Pap tests going back 10 years, you no longer need Pap tests. If you have had past treatment for cervical cancer or a condition that could lead to cancer, you need Pap tests and screening for cancer for at least 20 years after your treatment. If Pap tests have been discontinued, risk factors (such as a new sexual partner) need to be reassessed to determine if screening should be resumed. Some women have medical problems that increase the chance of getting cervical cancer. In these cases, your caregiver may recommend more frequent screening and Pap tests.  The human papillomavirus (HPV) test is an additional test that may be used for cervical cancer screening. The HPV test looks for the virus that can cause the cell changes on the cervix. The cells collected during the Pap test can be tested for HPV. The HPV test could be used to screen women aged 33 years and older, and should be used in women of any age who have unclear Pap test results. After the age of 71, women should have HPV testing at the same frequency as a Pap test.  Colorectal cancer can be detected and often prevented. Most routine colorectal cancer screening begins at the age of 24 and continues through age 1. However, your caregiver may recommend screening at an earlier age if you have risk factors for colon cancer. On a yearly basis, your caregiver may provide home test kits to check for hidden blood in the stool. Use of a small camera at the end of a tube, to directly examine the colon (sigmoidoscopy or colonoscopy), can detect the earliest forms of colorectal cancer. Talk to your caregiver about this at age 22, when routine screening begins. Direct examination of the colon should be repeated every 5 to 10 years through age 61, unless early forms of pre-cancerous polyps or small growths are found.  Hepatitis C blood testing is recommended for all people born from [birth year] through 1967 and any individual with known risks for hepatitis  C.  Practice safe sex. Use condoms and avoid high-risk sexual practices to reduce the spread of sexually transmitted infections (STIs). Sexually active women aged 14 and younger should be checked for Chlamydia, which is a common sexually transmitted infection. Older women with new or multiple partners should also be tested for Chlamydia. Testing for other STIs is recommended if you are sexually active and at increased risk.  Osteoporosis is a disease in which the bones lose minerals and strength with aging. This can result in serious bone fractures. The risk of osteoporosis can be identified using a bone density scan. Women ages 11 and over and women at risk for fractures or osteoporosis should discuss screening with their caregivers. Ask your caregiver whether you should be taking a calcium supplement or vitamin D to reduce the rate of osteoporosis.  Menopause can be associated with physical symptoms and risks. Hormone replacement therapy is available to decrease symptoms and risks. You should talk to your caregiver about whether hormone replacement therapy is right for you.  Use sunscreen with a sun protection factor (SPF) of 30 or greater. Apply sunscreen liberally and repeatedly throughout the day. You should seek shade when your shadow is shorter than you. Protect yourself by wearing long sleeves, pants, a wide-brimmed hat, and sunglasses year round, whenever you are outdoors.  Notify your caregiver of new moles or changes in moles, especially if there is a change in shape or color. Also notify  your caregiver if a mole is larger than the size of a pencil eraser.  Stay current with your immunizations. Document Released: 10/29/2010 Document Revised: 07/08/2011 Document Reviewed: 10/29/2010 [facility] Patient Information 2013 [place], [place].

## 2012-04-09 NOTE — Assessment & Plan Note (Signed)
Sister age 48

## 2012-04-09 NOTE — Progress Notes (Signed)
Cassidy Giles [birth year] [id number]    History:    The patient presents for annual exam.  Monthly 5 day cycle 2-3 days heavy, history of 5 cm fibroid. Not sexually active. Type II diabetic has followup scheduled with Dr. Talmage Nap January 2014. History of normal Paps and mammograms. Asthma primary care manages.  Past medical history, past surgical history, family history and social history were all reviewed and documented in the EPIC chart. Works in a school office. Right kidney in pelvis/overlying sacrum/normal IVP 2001.. Mother with uterine cancer/living. Sister with breast cancer unknown BRCA, survived. Numerous family members with type 2 diabetes and hypertension.   ROS:  A  ROS was performed and pertinent positives and negatives are included in the history.  Exam:  Filed Vitals:   04/09/12 0813  BP: 138/80    General appearance:  Normal Head/Neck:  Normal, without cervical or supraclavicular adenopathy. Thyroid:  Symmetrical, normal in size, without palpable masses or nodularity. Respiratory  Effort:  Normal  Auscultation:  Clear without wheezing or rhonchi Cardiovascular  Auscultation:  Regular rate, without rubs, murmurs or gallops  Edema/varicosities:  Not grossly evident Abdominal  Soft,nontender, without masses, guarding or rebound.  Liver/spleen:  No organomegaly noted  Hernia:  None appreciated  Skin  Inspection:  Grossly normal  Palpation:  Grossly normal Neurologic/psychiatric  Orientation:  Normal with appropriate conversation.  Mood/affect:  Normal  Genitourinary    Breasts: Examined lying and sitting.     Right: Without masses, retractions, discharge or axillary adenopathy.     Left: Without masses, retractions, discharge or axillary adenopathy.   Inguinal/mons:  Normal without inguinal adenopathy  External genitalia:  Normal  BUS/Urethra/Skene's glands:  Normal  Bladder:  Normal  Vagina:  Normal  Cervix:  Normal  Uterus:  Enlarged, Midline and  mobile  Adnexa/parametria:     Rt: Without masses or tenderness.   Lt: Without masses or tenderness.  Anus and perineum: Normal  Digital rectal exam: Normal sphincter tone without palpated masses or tenderness  Assessment/Plan:  46 y.o. SBF G0 for annual exam with complaint of vaginal itching.  Yeast Fibroid uterus Type 2 diabetes-Dr. Talmage Nap Obesity Asthma-primary care meds  Plan: Long discussion on lifestyle, need to decrease calories and increase exercise for weight loss and health. SBE's, continue annual mammogram, calcium rich diet, vitamin D 1000 daily encouraged. Options for fibroid uterus reviewed declines intervention at this time. Reviewed importance of calling if changes in cycles, bleeding between cycles, verbalized understanding. Condoms encouraged if become sexually active. UA, Pap normal 2012. Yeast, Diflucan 150 times one dose, instructed to call if no relief of symptoms.    Harrington Challenger [facility], 9:06 AM 04/09/2012

## 2012-04-10 LAB — URINALYSIS W MICROSCOPIC + REFLEX CULTURE
Casts: NONE SEEN
Glucose, UA: NEGATIVE mg/dL
Leukocytes, UA: NEGATIVE
Nitrite: NEGATIVE
Specific Gravity, Urine: 1.02 (ref 1.005–1.030)
pH: 6.5 (ref 5.0–8.0)

## 2012-04-16 ENCOUNTER — Encounter: Payer: Self-pay | Admitting: Women's Health

## 2012-08-26 ENCOUNTER — Telehealth: Payer: Self-pay | Admitting: *Deleted

## 2012-08-26 NOTE — Telephone Encounter (Signed)
Office visit is best, if not tender, leave alone, if tender could try antibiotic, Doxcycline 100 tid for 7 days.

## 2012-08-26 NOTE — Telephone Encounter (Signed)
Pt informed with the below note. Transferred to front desk 

## 2012-08-26 NOTE — Telephone Encounter (Signed)
Pt asked if you would call her regarding her Bartholin's cyst? Pt asked what options do she have other than draining the fluid out? Call back # [phone] Please advise

## 2012-08-27 ENCOUNTER — Encounter: Payer: Self-pay | Admitting: Women's Health

## 2012-08-27 ENCOUNTER — Ambulatory Visit (INDEPENDENT_AMBULATORY_CARE_PROVIDER_SITE_OTHER): Payer: BC Managed Care – PPO | Admitting: Women's Health

## 2012-08-27 DIAGNOSIS — N750 Cyst of Bartholin's gland: Secondary | ICD-10-CM

## 2012-08-27 NOTE — Patient Instructions (Signed)
Bartholin's Cyst or Abscess  Bartholin's glands are small glands located within the folds of skin (labia) along the sides of the lower opening of the vagina (birth canal). A cyst may develop when the duct of the gland becomes blocked. When this happens, fluid that accumulates within the cyst can become infected. This is known as an abscess. The Bartholin gland produces a mucous fluid to lubricate the outside of the vagina during sexual intercourse.  SYMPTOMS    Patients with a small cyst may not have any symptoms.   Mild discomfort to severe pain depending on the size of the cyst and if it is infected (abscess).   Pain, redness, and swelling around the lower opening of the vagina.   Painful intercourse.   Pressure in the perineal area.   Swelling of the lips of the vagina (labia).   The cyst or abscess can be on one side or both sides of the vagina.  DIAGNOSIS    A large swelling is seen in the lower vagina area by your caregiver.   Painful to touch.   Redness and pain, if it is an abscess.  TREATMENT    Sometimes the cyst will go away on its own.   Apply warm wet compresses to the area or take hot sitz baths several times a day.   An incision to drain the cyst or abscess with local anesthesia.   Culture the pus, if it is an abscess.   Antibiotic treatment, if it is an abscess.   Cut open the gland and suture the edges to make the opening of the gland bigger (marsupialization).   Remove the whole gland if the cyst or abscess returns.  PREVENTION    Practice good hygiene.   Clean the vaginal area with a mild soap and soft cloth when bathing.   Do not rub hard in the vaginal area when bathing.   Protect the crotch area with a padded cushion if you take long bike rides or ride horses.   Be sure you are well lubricated when you have sexual intercourse.  HOME CARE INSTRUCTIONS    If your cyst or abscess was opened, a small piece of gauze, or a drain, may have been placed in the wound to allow  drainage. Do not remove this gauze or drain unless directed by your caregiver.   Wear feminine pads, not tampons, as needed for any drainage or bleeding.   If antibiotics were prescribed, take them exactly as directed. Finish the entire course.   Only take over-the-counter or prescription medicines for pain, discomfort, or fever as directed by your caregiver.  SEEK IMMEDIATE MEDICAL CARE IF:    You have an increase in pain, redness, swelling, or drainage.   You have bleeding from the wound which results in the use of more than the number of pads suggested by your caregiver in 24 hours.   You have chills.   You have a fever.   You develop any new problems (symptoms) or aggravation of your existing condition.  MAKE SURE YOU:    Understand these instructions.   Will watch your condition.   Will get help right away if you are not doing well or get worse.  Document Released: 04/15/2005 Document Revised: 07/08/2011 Document Reviewed: 12/02/2007  [employer] Patient Information 2013 [employer].

## 2012-08-27 NOTE — Progress Notes (Signed)
Patient ID: Cassidy Giles, female   DOB: 1965-05-22, 47 y.o.   MRN: [id number]  Presents for evaluation of persistent left Bartholin cyst for  "at least a year".  Not painful but is bothersome, uncomfortable, and embarrassing. Cyst fluctuates in size in relation to menstrual cycle and is currently smaller. No discharge from cyst. Denies urinary symptoms or vaginal discharge. Menstrual cycles have become irregular in past year. Not sexually active. Has declined drainage in past.   Exam: Appears well. 3 cm round, nontender, fluid filled cyst located on left side of vagina.   Bartholin Cyst (left)  Plan: Patient was educated on options for removal, drainage. Nervous about pain and will consider scheduling on a Friday to avoid missing work. Options for surgery were discussed if cyst reoccurs, but drainage was presented as first line for cyst treatment.

## 2012-09-11 ENCOUNTER — Encounter: Payer: Self-pay | Admitting: Women's Health

## 2012-09-11 ENCOUNTER — Ambulatory Visit (INDEPENDENT_AMBULATORY_CARE_PROVIDER_SITE_OTHER): Payer: BC Managed Care – PPO | Admitting: Women's Health

## 2012-09-11 DIAGNOSIS — N750 Cyst of Bartholin's gland: Secondary | ICD-10-CM

## 2012-09-11 NOTE — Progress Notes (Signed)
Patient ID: Cassidy Giles, female   DOB: 1965-12-26, 47 y.o.   MRN: [id number] Presents to have left Bartholin's cyst drained. Denies pain but states uncomfortable due to size. Persistent greater than one year, fluctuates in size but is always there. Not sexually active. Denies vaginal discharge, urinary symptoms, abdominal pain or fever.  Exam: Appears well, 4 cm nontender Bartholin's cyst, area prepped with Betadine, injected with 1% Xylocaine, opened with #11 scalpel copious thin brown drainage with no odor, reduced completely. Tolerated well.  Bartholin cyst   Plan: Loose clothes, instructed to call if enlarges or changes. Surgical management of marsupialization reviewed.

## 2013-04-09 ENCOUNTER — Encounter: Payer: Self-pay | Admitting: Women's Health

## 2013-04-20 ENCOUNTER — Encounter: Payer: Self-pay | Admitting: Women's Health

## 2013-04-20 ENCOUNTER — Other Ambulatory Visit (HOSPITAL_COMMUNITY)
Admission: RE | Admit: 2013-04-20 | Discharge: 2013-04-20 | Disposition: A | Discharge status: Home / Self Care | Payer: BC Managed Care – PPO | Source: Ambulatory Visit | Attending: Gynecology | Admitting: Gynecology

## 2013-04-20 ENCOUNTER — Ambulatory Visit (INDEPENDENT_AMBULATORY_CARE_PROVIDER_SITE_OTHER): Payer: BC Managed Care – PPO | Admitting: Women's Health

## 2013-04-20 VITALS — BP 150/80 | Ht 63.5 in | Wt 240.0 lb

## 2013-04-20 DIAGNOSIS — Z01419 Encounter for gynecological examination (general) (routine) without abnormal findings: Secondary | ICD-10-CM

## 2013-04-20 NOTE — Progress Notes (Signed)
Cassidy Giles 1965/11/08 [id number]    History:    The patient presents for annual exam.  Cycles have been every month now every 2-3 months. Not sexually active in years. History of 5 cm fibroid. Normal Pap and mammogram history. As primary care. Dr. Talmage Nap manages type 2 diabetes and hypercholesterolemia. Obesity weight stable.   Past medical history, past surgical history, family history and social history were all reviewed and documented in the EPIC chart. Works in the office at [employer]. Sister with breast cancer age 49 unknown BRCA status. Brother died of lung cancer. Numerous family members type 2 diabetes. Right kidney in the pelvis normal IVP 2001.   ROS:  A  ROS was performed and pertinent positives and negatives are included in the history.  Exam:  Filed Vitals:   04/20/13 1412  BP: 150/80    General appearance:  Normal Head/Neck:  Normal, without cervical or supraclavicular adenopathy. Thyroid:  Symmetrical, normal in size, without palpable masses or nodularity. Respiratory  Effort:  Normal  Auscultation:  Clear without wheezing or rhonchi Cardiovascular  Auscultation:  Regular rate, without rubs, murmurs or gallops  Edema/varicosities:  Not grossly evident Abdominal  Soft,nontender, without masses, guarding or rebound.  Liver/spleen:  No organomegaly noted  Hernia:  None appreciated  Skin  Inspection:  Grossly normal  Palpation:  Grossly normal Neurologic/psychiatric  Orientation:  Normal with appropriate conversation.  Mood/affect:  Normal  Genitourinary    Breasts: Examined lying and sitting.     Right: Without masses, retractions, discharge or axillary adenopathy.     Left: Without masses, retractions, discharge or axillary adenopathy.   Inguinal/mons:  Normal without inguinal adenopathy  External genitalia:  Normal  BUS/Urethra/Skene's glands:  Normal  Bladder:  Normal  Vagina:  Normal  Cervix:  Normal  Uterus:  Enlarged/fibroids in size,  shape and contour.  Midline and mobile  Adnexa/parametria:     Rt: Without masses or tenderness.   Lt: Without masses or tenderness.  Anus and perineum: Normal  Digital rectal exam: Normal sphincter tone without palpated masses or tenderness  Assessment/Plan:  47 y.o. SBF G0 for annual exam with no complaints.  Perimenopausal Fibroid uterus Asthma-primary care manages Type 2 diabetes/upper cholesterolemia-Dr. Talmage Nap manages labs and meds  Plan: Menopause reviewed, SBE's, continue annual mammogram, calcium rich diet, vitamin D 1000 daily encouraged. Condoms encouraged if become sexually active. Pap. Pap normal 2012, new screening guidelines reviewed. Blood pressure borderline, instructed to check away from office followup with primary care if continues greater than 130/80.   Harrington Chall[facility], 3:05 PM 04/20/2013

## 2013-04-20 NOTE — Patient Instructions (Signed)
Health Recommendations for Postmenopausal Women Respected and ongoing research has looked at the most common causes of death, disability, and poor quality of life in postmenopausal women. The causes include heart disease, diseases of blood vessels, diabetes, depression, cancer, and bone loss (osteoporosis). Many things can be done to help lower the chances of developing these and other common problems: CARDIOVASCULAR DISEASE Heart Disease: A heart attack is a medical emergency. Know the signs and symptoms of a heart attack. Below are things women can do to reduce their risk for heart disease.   Do not smoke. If you smoke, quit.  Aim for a healthy weight. Being overweight causes many preventable deaths. Eat a healthy and balanced diet and drink an adequate amount of liquids.  Get moving. Make a commitment to be more physically active. Aim for 30 minutes of activity on most, if not all days of the week.  Eat for heart health. Choose a diet that is low in saturated fat and cholesterol and eliminate trans fat. Include whole grains, vegetables, and fruits. Read and understand the labels on food containers before buying.  Know your numbers. Ask your caregiver to check your blood pressure, cholesterol (total, HDL, LDL, triglycerides) and blood glucose. Work with your caregiver on improving your entire clinical picture.  High blood pressure. Limit or stop your table salt intake (try salt substitute and food seasonings). Avoid salty foods and drinks. Read labels on food containers before buying. Eating well and exercising can help control high blood pressure. STROKE  Stroke is a medical emergency. Stroke may be the result of a blood clot in a blood vessel in the brain or by a brain hemorrhage (bleeding). Know the signs and symptoms of a stroke. To lower the risk of developing a stroke:  Avoid fatty foods.  Quit smoking.  Control your diabetes, blood pressure, and irregular heart rate. THROMBOPHLEBITIS  (BLOOD CLOT) OF THE LEG  Becoming overweight and leading a stationary lifestyle may also contribute to developing blood clots. Controlling your diet and exercising will help lower the risk of developing blood clots. CANCER SCREENING  Breast Cancer: Take steps to reduce your risk of breast cancer.  You should practice "breast self-awareness." This means understanding the normal appearance and feel of your breasts and should include breast self-examination. Any changes detected, no matter how small, should be reported to your caregiver.  After age 40, you should have a clinical breast exam (CBE) every year.  Starting at age 40, you should consider having a mammogram (breast X-ray) every year.  If you have a family history of breast cancer, talk to your caregiver about genetic screening.  If you are at high risk for breast cancer, talk to your caregiver about having an MRI and a mammogram every year.  Intestinal or Stomach Cancer: Tests to consider are a rectal exam, fecal occult blood, sigmoidoscopy, and colonoscopy. Women who are high risk may need to be screened at an earlier age and more often.  Cervical Cancer:  Beginning at age 30, you should have a Pap test every 3 years as long as the past 3 Pap tests have been normal.  If you have had past treatment for cervical cancer or a condition that could lead to cancer, you need Pap tests and screening for cancer for at least 20 years after your treatment.  If you had a hysterectomy for a problem that was not cancer or a condition that could lead to cancer, then you no longer need Pap tests.    If you are between ages 65 and 70, and you have had normal Pap tests going back 10 years, you no longer need Pap tests.  If Pap tests have been discontinued, risk factors (such as a new sexual partner) need to be reassessed to determine if screening should be resumed.  Some medical problems can increase the chance of getting cervical cancer. In these  cases, your caregiver may recommend more frequent screening and Pap tests.  Uterine Cancer: If you have vaginal bleeding after reaching menopause, you should notify your caregiver.  Ovarian cancer: Other than yearly pelvic exams, there are no reliable tests available to screen for ovarian cancer at this time except for yearly pelvic exams.  Lung Cancer: Yearly chest X-rays can detect lung cancer and should be done on high risk women, such as cigarette smokers and women with chronic lung disease (emphysema).  Skin Cancer: A complete body skin exam should be done at your yearly examination. Avoid overexposure to the sun and ultraviolet light lamps. Use a strong sun block cream when in the sun. All of these things are important in lowering the risk of skin cancer. MENOPAUSE Menopause Symptoms: Hormone therapy products are effective for treating symptoms associated with menopause:  Moderate to severe hot flashes.  Night sweats.  Mood swings.  Headaches.  Tiredness.  Loss of sex drive.  Insomnia.  Other symptoms. Hormone replacement carries certain risks, especially in older women. Women who use or are thinking about using estrogen or estrogen with progestin treatments should discuss that with their caregiver. Your caregiver will help you understand the benefits and risks. The ideal dose of hormone replacement therapy is not known. The Food and Drug Administration (FDA) has concluded that hormone therapy should be used only at the lowest doses and for the shortest amount of time to reach treatment goals.  OSTEOPOROSIS Protecting Against Bone Loss and Preventing Fracture: If you use hormone therapy for prevention of bone loss (osteoporosis), the risks for bone loss must outweigh the risk of the therapy. Ask your caregiver about other medications known to be safe and effective for preventing bone loss and fractures. To guard against bone loss or fractures, the following is recommended:  If  you are less than age 50, take 1000 mg of calcium and at least 600 mg of Vitamin D per day.  If you are greater than age 50 but less than age 70, take 1200 mg of calcium and at least 600 mg of Vitamin D per day.  If you are greater than age 70, take 1200 mg of calcium and at least 800 mg of Vitamin D per day. Smoking and excessive alcohol intake increases the risk of osteoporosis. Eat foods rich in calcium and vitamin D and do weight bearing exercises several times a week as your caregiver suggests. DIABETES Diabetes Melitus: If you have Type I or Type 2 diabetes, you should keep your blood sugar under control with diet, exercise and recommended medication. Avoid too many sweets, starchy and fatty foods. Being overweight can make control more difficult. COGNITION AND MEMORY Cognition and Memory: Menopausal hormone therapy is not recommended for the prevention of cognitive disorders such as Alzheimer's disease or memory loss.  DEPRESSION  Depression may occur at any age, but is common in elderly women. The reasons may be because of physical, medical, social (loneliness), or financial problems and needs. If you are experiencing depression because of medical problems and control of symptoms, talk to your caregiver about this. Physical activity and   exercise may help with mood and sleep. Community and volunteer involvement may help your sense of value and worth. If you have depression and you feel that the problem is getting worse or becoming severe, talk to your caregiver about treatment options that are best for you. ACCIDENTS  Accidents are common and can be serious in the elderly woman. Prepare your house to prevent accidents. Eliminate throw rugs, place hand bars in the bath, shower and toilet areas. Avoid wearing high heeled shoes or walking on wet, snowy, and icy areas. Limit or stop driving if you have vision or hearing problems, or you feel you are unsteady with you movements and  reflexes. HEPATITIS C Hepatitis C is a type of viral infection affecting the liver. It is spread mainly through contact with blood from an infected person. It can be treated, but if left untreated, it can lead to severe liver damage over years. Many people who are infected do not know that the virus is in their blood. If you are a "baby-boomer", it is recommended that you have one screening test for Hepatitis C. IMMUNIZATIONS  Several immunizations are important to consider having during your senior years, including:   Tetanus, diptheria, and pertussis booster shot.  Influenza every year before the flu season begins.  Pneumonia vaccine.  Shingles vaccine.  Others as indicated based on your specific needs. Talk to your caregiver about these. Document Released: 06/07/2005 Document Revised: 04/01/2012 Document Reviewed: 02/01/2008 ExitCare Patient Information 2014 [employer].  

## 2014-04-06 ENCOUNTER — Encounter: Payer: Self-pay | Admitting: Women's Health

## 2014-04-25 ENCOUNTER — Telehealth: Payer: Self-pay | Admitting: *Deleted

## 2014-04-25 NOTE — Telephone Encounter (Signed)
Pt informed with the below note. 

## 2014-04-25 NOTE — Telephone Encounter (Signed)
Pt has annual scheduled on 04/27/14 c/o of left side discomfort asked if ultrasound could be done same day? Please advise

## 2014-04-25 NOTE — Telephone Encounter (Signed)
Best to sched after appt, inform her most insurances will not cover if done same day as annual.

## 2014-04-27 ENCOUNTER — Encounter: Payer: Self-pay | Admitting: Women's Health

## 2014-04-27 ENCOUNTER — Ambulatory Visit (INDEPENDENT_AMBULATORY_CARE_PROVIDER_SITE_OTHER): Payer: BC Managed Care – PPO | Admitting: Women's Health

## 2014-04-27 VITALS — BP 122/82 | Ht 64.0 in | Wt 248.8 lb

## 2014-04-27 DIAGNOSIS — B3731 Acute candidiasis of vulva and vagina: Secondary | ICD-10-CM

## 2014-04-27 DIAGNOSIS — N898 Other specified noninflammatory disorders of vagina: Secondary | ICD-10-CM

## 2014-04-27 DIAGNOSIS — Z01419 Encounter for gynecological examination (general) (routine) without abnormal findings: Secondary | ICD-10-CM

## 2014-04-27 DIAGNOSIS — B373 Candidiasis of vulva and vagina: Secondary | ICD-10-CM

## 2014-04-27 LAB — WET PREP FOR TRICH, YEAST, CLUE
CLUE CELLS WET PREP: NONE SEEN
Trich, Wet Prep: NONE SEEN
WBC, Wet Prep HPF POC: NONE SEEN

## 2014-04-27 LAB — URINALYSIS W MICROSCOPIC + REFLEX CULTURE
Bilirubin Urine: NEGATIVE
Casts: NONE SEEN
Crystals: NONE SEEN
Glucose, UA: NEGATIVE mg/dL
KETONES UR: NEGATIVE mg/dL
Leukocytes, UA: NEGATIVE
Nitrite: NEGATIVE
PROTEIN: NEGATIVE mg/dL
Specific Gravity, Urine: >1.03 — ABNORMAL HIGH (ref 1.005–1.030)
Urobilinogen, UA: 0.2 mg/dL (ref 0.0–1.0)
WBC UA: NONE SEEN WBC/hpf (ref <3)
pH: 5.5 (ref 5.0–8.0)

## 2014-04-27 MED ORDER — FLUCONAZOLE 150 MG PO TABS: 150.0000 mg | ORAL_TABLET | Freq: Once | ORAL | Status: DC

## 2014-04-27 NOTE — Patient Instructions (Signed)

## 2014-04-27 NOTE — Progress Notes (Signed)
Cassidy Giles August 06, 1965 [id number]    History:    Presents for annual exam.  Cycles every 5-6 months last cycle November. Not sexually active. Increasing menopausal symptoms. Year prior cycles every 2-3 months prior to that cycles monthly. Has a colonoscopy scheduled and of January. Normal Pap and mammogram history. Type 2 diabetes Dr. Chalmers Cater manages. Sr. breast cancer unknown BRCA status. Right kidneys in the pelvis with a normal IUD 2001. Fibroid uterus last ultrasound 2012 5 cm.  Past medical history, past surgical history, family history and social history were all reviewed and documented in the EPIC chart. Works at [employer]. Mother hypertension/diabetes/uterine cancer after menopause  ROS:  A ROS was performed and pertinent positives and negatives are included.  Exam:  Filed Vitals:   04/27/14 0820  BP: 122/82    General appearance:  Normal Thyroid:  Symmetrical, normal in size, without palpable masses or nodularity. Respiratory  Auscultation:  Clear without wheezing or rhonchi Cardiovascular  Auscultation:  Regular rate, without rubs, murmurs or gallops  Edema/varicosities:  Not grossly evident Abdominal  Soft,nontender, without masses, guarding or rebound.  Liver/spleen:  No organomegaly noted  Hernia:  None appreciated  Skin  Inspection:  Grossly normal   Breasts: Examined lying and sitting.     Right: Without masses, retractions, discharge or axillary adenopathy.     Left: Without masses, retractions, discharge or axillary adenopathy. Gentitourinary   Inguinal/mons:  Normal without inguinal adenopathy  External genitalia:  Normal  BUS/Urethra/Skene's glands:  Normal  Vagina:  Scant white discharge wet prep positive for yeast  Cervix:  Normal  Uterus:   8 week fibroid uterus nontender .  Midline and mobile  Adnexa/parametria:     Rt: Without masses or tenderness.   Lt: Without masses or tenderness.  Anus and perineum: Normal  Digital  rectal exam: Normal sphincter tone without palpated masses or tenderness  Assessment/Plan:  48 y.o.SBF G0  for annual exam with complaint of occasional left sided pain, vaginal discharge..     Normal perimenopausal exam Not sexually active Diabetes type 2 Dr. Chalmers Cater manages Fibroid uterus Obesity Yeast History of hematuria has follow-up scheduled with primary care  Plan: SBE's, continue annual 3-D tomography mammogram, calcium rich diet, vitamin D 1000 daily encouraged. Instructed to call if menopausal symptoms increase, irregular spotting or problems. Reviewed importance of increasing regular exercise and decreasing calories for weight loss. Condoms encouraged if sexually active. Diflucan 150 by mouth today, refill given instructed to call if no relief of discharge. UA, Pap 2014, new screening guidelines reviewed.    Huel Cote WHNP, 1:50 PM 04/27/2014

## 2014-04-29 LAB — URINE CULTURE
Colony Count: NO GROWTH
ORGANISM ID, BACTERIA: NO GROWTH

## 2014-06-27 ENCOUNTER — Other Ambulatory Visit: Payer: Self-pay | Admitting: Gastroenterology

## 2014-09-27 ENCOUNTER — Ambulatory Visit (INDEPENDENT_AMBULATORY_CARE_PROVIDER_SITE_OTHER): Payer: BC Managed Care – PPO | Admitting: Women's Health

## 2014-09-27 ENCOUNTER — Encounter: Payer: Self-pay | Admitting: Women's Health

## 2014-09-27 VITALS — BP 140/84 | Ht 64.0 in | Wt 253.0 lb

## 2014-09-27 DIAGNOSIS — N926 Irregular menstruation, unspecified: Secondary | ICD-10-CM

## 2014-09-27 LAB — WET PREP FOR TRICH, YEAST, CLUE
CLUE CELLS WET PREP: NONE SEEN
TRICH WET PREP: NONE SEEN
WBC, Wet Prep HPF POC: NONE SEEN
YEAST WET PREP: NONE SEEN

## 2014-09-27 NOTE — Progress Notes (Signed)
Patient ID: Cassidy Giles, female   DOB: June 04, 1965, 49 y.o.   MRN: [id number] Presents with complaint of irregular bleeding.  09/09/2014 started with spotting and cramping for several days, had 5-6 days of heavy menstrual type bleeding, and has been bleeding/spotting since. Last normal cycle 02/2014. Denies menopausal symptoms of hot flushes but reports increased problems with sleep Not sexually active-years. Denies urinary symptoms, vaginal discharge or fever.  Sam: Appears well. Abdomen soft nontender external genitalia within normal limits, speculum exam scant dark blood noted, wet prep negative, bimanual no CMT or adnexal fullness or tenderness.  DUB  Plan: FSH, TSH, prolactin. Reviewed since bleeding minimal today, if labs normal will watch. If bleeding persists or this occurs again will  proceed to a sonohysterogram with Dr. Toney Rakes.

## 2014-09-27 NOTE — Patient Instructions (Signed)
Dysfunctional Uterine Bleeding Normally, menstrual periods begin between ages 11 to 17 in Cassidy Giles women. A normal menstrual cycle/period may begin every 23 days up to 35 days and lasts from 1 to 7 days. Around 12 to 14 days before your menstrual period starts, ovulation (ovary produces an egg) occurs. When counting the time between menstrual periods, count from the first day of bleeding of the previous period to the first day of bleeding of the next period. Dysfunctional (abnormal) uterine bleeding is bleeding that is different from a normal menstrual period. Your periods may come earlier or later than usual. They may be lighter, have blood clots or be heavier. You may have bleeding between periods, or you may skip one period or more. You may have bleeding after sexual intercourse, bleeding after menopause, or no menstrual period. CAUSES   Pregnancy (normal, miscarriage, tubal).  IUDs (intrauterine device, birth control).  Birth control pills.  Hormone treatment.  Menopause.  Infection of the cervix.  Blood clotting problems.  Infection of the inside lining of the uterus.  Endometriosis, inside lining of the uterus growing in the pelvis and other female organs.  Adhesions (scar tissue) inside the uterus.  Obesity or severe weight loss.  Uterine polyps inside the uterus.  Cancer of the vagina, cervix, or uterus.  Ovarian cysts or polycystic ovary syndrome.  Medical problems (diabetes, thyroid disease).  Uterine fibroids (noncancerous tumor).  Problems with your female hormones.  Endometrial hyperplasia, very thick lining and enlarged cells inside of the uterus.  Medicines that interfere with ovulation.  Radiation to the pelvis or abdomen.  Chemotherapy. DIAGNOSIS   Your doctor will discuss the history of your menstrual periods, medicines you are taking, changes in your weight, stress in your life, and any medical problems you may have.  Your doctor will do a physical  and pelvic examination.  Your doctor may want to perform certain tests to make a diagnosis, such as:  Pap test.  Blood tests.  Cultures for infection.  CT scan.  Ultrasound.  Hysteroscopy.  Laparoscopy.  MRI.  Hysterosalpingography.  D and C.  Endometrial biopsy. TREATMENT  Treatment will depend on the cause of the dysfunctional uterine bleeding (DUB). Treatment may include:  Observing your menstrual periods for a couple of months.  Prescribing medicines for medical problems, including:  Antibiotics.  Hormones.  Birth control pills.  Removing an IUD (intrauterine device, birth control).  Surgery:  D and C (scrape and remove tissue from inside the uterus).  Laparoscopy (examine inside the abdomen with a lighted tube).  Uterine ablation (destroy lining of the uterus with electrical current, laser, heat, or freezing).  Hysteroscopy (examine cervix and uterus with a lighted tube).  Hysterectomy (remove the uterus). HOME CARE INSTRUCTIONS   If medicines were prescribed, take exactly as directed. Do not change or switch medicines without consulting your caregiver.  Long term heavy bleeding may result in iron deficiency. Your caregiver may have prescribed iron pills. They help replace the iron that your body lost from heavy bleeding. Take exactly as directed.  Do not take aspirin or medicines that contain aspirin one week before or during your menstrual period. Aspirin may make the bleeding worse.  If you need to change your sanitary pad or tampon more than once every 2 hours, stay in bed with your feet elevated and a cold pack on your lower abdomen. Rest as much as possible, until the bleeding stops or slows down.  Eat well-balanced meals. Eat foods high in iron. Examples   are:  Leafy green vegetables.  Whole-grain breads and cereals.  Eggs.  Meat.  Liver.  Do not try to lose weight until the abnormal bleeding has stopped and your blood iron level is  back to normal. Do not lift more than ten pounds or do strenuous activities when you are bleeding.  For a couple of months, make note on your calendar, marking the start and ending of your period, and the type of bleeding (light, medium, heavy, spotting, clots or missed periods). This is for your caregiver to better evaluate your problem. SEEK MEDICAL CARE IF:   You develop nausea (feeling sick to your stomach) and vomiting, dizziness, or diarrhea while you are taking your medicine.  You are getting lightheaded or weak.  You have any problems that may be related to the medicine you are taking.  You develop pain with your DUB.  You want to remove your IUD.  You want to stop or change your birth control pills or hormones.  You have any type of abnormal bleeding mentioned above.  You are over 16 years old and have not had a menstrual period yet.  You are 49 years old and you are still having menstrual periods.  You have any of the symptoms mentioned above.  You develop a rash. SEEK IMMEDIATE MEDICAL CARE IF:   An oral temperature above 102 F (38.9 C) develops.  You develop chills.  You are changing your sanitary pad or tampon more than once an hour.  You develop abdominal pain.  You pass out or faint. Document Released: 04/12/2000 Document Revised: 07/08/2011 Document Reviewed: 03/14/2009 ExitCare Patient Information 2015 [employer]. This information is not intended to replace advice given to you by your health care provider. Make sure you discuss any questions you have with your health care provider.  

## 2014-09-28 LAB — PROLACTIN: PROLACTIN: 7.4 ng/mL

## 2014-09-28 LAB — FOLLICLE STIMULATING HORMONE: FSH: 18.4 m[IU]/mL

## 2014-09-28 LAB — TSH: TSH: 1.694 u[IU]/mL (ref 0.350–4.500)

## 2015-04-10 ENCOUNTER — Encounter: Payer: Self-pay | Admitting: Women's Health

## 2015-05-02 ENCOUNTER — Ambulatory Visit (INDEPENDENT_AMBULATORY_CARE_PROVIDER_SITE_OTHER): Payer: BC Managed Care – PPO | Admitting: Women's Health

## 2015-05-02 ENCOUNTER — Other Ambulatory Visit (HOSPITAL_COMMUNITY)
Admission: RE | Admit: 2015-05-02 | Discharge: 2015-05-02 | Disposition: A | Discharge status: Home / Self Care | Payer: BC Managed Care – PPO | Source: Ambulatory Visit | Attending: Women's Health | Admitting: Women's Health

## 2015-05-02 ENCOUNTER — Encounter: Payer: Self-pay | Admitting: Women's Health

## 2015-05-02 VITALS — BP 132/80 | Ht 64.0 in | Wt 256.0 lb

## 2015-05-02 DIAGNOSIS — R87810 Cervical high risk human papillomavirus (HPV) DNA test positive: Secondary | ICD-10-CM | POA: Insufficient documentation

## 2015-05-02 DIAGNOSIS — Z1151 Encounter for screening for human papillomavirus (HPV): Secondary | ICD-10-CM | POA: Diagnosis not present

## 2015-05-02 DIAGNOSIS — Z01419 Encounter for gynecological examination (general) (routine) without abnormal findings: Secondary | ICD-10-CM | POA: Diagnosis not present

## 2015-05-02 NOTE — Progress Notes (Signed)
Cassidy Giles [birth year] [id number]    History:    Presents for annual exam.  Monthly cycle most months, had 3 cycles in November , one cycle in December , September and October. Minimal menopausal symptoms. Not sexually active in greater than a year. 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 Normal Pap and mammogram history. Type 2 diabetes managed by Dr. Chalmers Cater. History of fibroids largest 5 cm.  Past medical history, past surgical history, family history and social history were all reviewed and documented in the EPIC chart.  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 Mother hypertension, diabetes , uterine cancer after menopause. History of the right kidney in the pelvis.  ROS:  A ROS was performed and pertinent positives and negatives are included.  Exam:  Filed Vitals:   05/02/15 0845  BP: 132/80    General appearance:  Normal Thyroid:  Symmetrical, normal in size, without palpable masses or nodularity. Respiratory  Auscultation:  Clear without wheezing or rhonchi Cardiovascular  Auscultation:  Regular rate, without rubs, murmurs or gallops  Edema/varicosities:  Not grossly evident Abdominal  Soft,nontender, without masses, guarding or rebound.  Liver/spleen:  No organomegaly noted  Hernia:  None appreciated  Skin  Inspection:  Grossly normal   Breasts: Examined lying and sitting.     Right: Without masses, retractions, discharge or axillary adenopathy.     Left: Without masses, retractions, discharge or axillary adenopathy. Gentitourinary   Inguinal/mons:  Normal without inguinal adenopathy  External genitalia:  Normal  BUS/Urethra/Skene's glands:  Normal  Vagina:  Normal  Cervix:  Normal  Uterus:   Enlarged 10 week size mobile nontender/ fibroid uterus  Adnexa/parametria:     Rt: Without masses or tenderness.   Lt: Without masses or tenderness.  Anus and perineum: Normal  Digital rectal exam: Normal sphincter tone without palpated masses or  tenderness  Assessment/Plan:  50 y.o.  SBF G0 for annual exam.    Perimenopausal   [facility] 16  06/2014)  Type 2 diabetes -Dr. Chalmers Cater manages labs and meds   Sister Breast cancer age 48/ survivor Fibroid uterus asymptomatic  Obesity  Plan:  Instructed to keep menstrual log.  Reviewed sonohysterogram if cycles less than 21 days or last greater than 7 days.  SBE's, annual 3D screening mammogram encouraged, history of normal mammograms /sister history. Reviewed importance of increasing exercise and decreasing carbohydrates for weight loss and for type 2 diabetes.  Declines need for contraception, condoms encouraged.  Vitamin D 1000 daily encouraged. UA, Pap with HR HPV typing, new screening guidelines reviewed.  Huel Cote [facility], 10:53 AM 05/02/2015

## 2015-05-02 NOTE — Patient Instructions (Signed)
Health Maintenance, Female Adopting a healthy lifestyle and getting preventive care can go a long way to promote health and wellness. Talk with your health care provider about what schedule of regular examinations is right for you. This is a good chance for you to check in with your provider about disease prevention and staying healthy. In between checkups, there are plenty of things you can do on your own. Experts have done a lot of research about which lifestyle changes and preventive measures are most likely to keep you healthy. Ask your health care provider for more information. WEIGHT AND DIET  Eat a healthy diet  Be sure to include plenty of vegetables, fruits, low-fat dairy products, and lean protein.  Do not eat a lot of foods high in solid fats, added sugars, or salt.  Get regular exercise. This is one of the most important things you can do for your health.  Most adults should exercise for at least 150 minutes each week. The exercise should increase your heart rate and make you sweat (moderate-intensity exercise).  Most adults should also do strengthening exercises at least twice a week. This is in addition to the moderate-intensity exercise.  Maintain a healthy weight  Body mass index (BMI) is a measurement that can be used to identify possible weight problems. It estimates body fat based on height and weight. Your health care provider can help determine your BMI and help you achieve or maintain a healthy weight.  For females 48 years of age and older:   A BMI below 18.5 is considered underweight.  A BMI of 18.5 to 24.9 is normal.  A BMI of 25 to 29.9 is considered overweight.  A BMI of 30 and above is considered obese.  Watch levels of cholesterol and blood lipids  You should start having your blood tested for lipids and cholesterol at 52 years of age, then have this test every 5 years.  You may need to have your cholesterol levels checked more often if:  Your lipid  or cholesterol levels are high.  You are older than 52 years of age.  You are at high risk for heart disease.  CANCER SCREENING   Lung Cancer  Lung cancer screening is recommended for adults 58-104 years old who are at high risk for lung cancer because of a history of smoking.  A yearly low-dose CT scan of the lungs is recommended for people who:  Currently smoke.  Have quit within the past 15 years.  Have at least a 30-pack-year history of smoking. A pack year is smoking an average of one pack of cigarettes a day for 1 year.  Yearly screening should continue until it has been 15 years since you quit.  Yearly screening should stop if you develop a health problem that would prevent you from having lung cancer treatment.  Breast Cancer  Practice breast self-awareness. This means understanding how your breasts normally appear and feel.  It also means doing regular breast self-exams. Let your health care provider know about any changes, no matter how small.  If you are in your 20s or 30s, you should have a clinical breast exam (CBE) by a health care provider every 1-3 years as part of a regular health exam.  If you are 84 or older, have a CBE every year. Also consider having a breast X-ray (mammogram) every year.  If you have a family history of breast cancer, talk to your health care provider about genetic screening.  If you  are at high risk for breast cancer, talk to your health care provider about having an MRI and a mammogram every year.  Breast cancer gene (BRCA) assessment is recommended for women who have family members with BRCA-related cancers. BRCA-related cancers include:  Breast.  Ovarian.  Tubal.  Peritoneal cancers.  Results of the assessment will determine the need for genetic counseling and BRCA1 and BRCA2 testing. Cervical Cancer Your health care provider may recommend that you be screened regularly for cancer of the pelvic organs (ovaries, uterus, and  vagina). This screening involves a pelvic examination, including checking for microscopic changes to the surface of your cervix (Pap test). You may be encouraged to have this screening done every 3 years, beginning at age 67.  For women ages 56-65, health care providers may recommend pelvic exams and Pap testing every 3 years, or they may recommend the Pap and pelvic exam, combined with testing for human papilloma virus (HPV), every 5 years. Some types of HPV increase your risk of cervical cancer. Testing for HPV may also be done on women of any age with unclear Pap test results.  Other health care providers may not recommend any screening for nonpregnant women who are considered low risk for pelvic cancer and who do not have symptoms. Ask your health care provider if a screening pelvic exam is right for you.  If you have had past treatment for cervical cancer or a condition that could lead to cancer, you need Pap tests and screening for cancer for at least 20 years after your treatment. If Pap tests have been discontinued, your risk factors (such as having a new sexual partner) need to be reassessed to determine if screening should resume. Some women have medical problems that increase the chance of getting cervical cancer. In these cases, your health care provider may recommend more frequent screening and Pap tests. Colorectal Cancer  This type of cancer can be detected and often prevented.  Routine colorectal cancer screening usually begins at 52 years of age and continues through 52 years of age.  Your health care provider may recommend screening at an earlier age if you have risk factors for colon cancer.  Your health care provider may also recommend using home test kits to check for hidden blood in the stool.  A small camera at the end of a tube can be used to examine your colon directly (sigmoidoscopy or colonoscopy). This is done to check for the earliest forms of colorectal  cancer.  Routine screening usually begins at age 40.  Direct examination of the colon should be repeated every 5-10 years through 52 years of age. However, you may need to be screened more often if early forms of precancerous polyps or small growths are found. Skin Cancer  Check your skin from head to toe regularly.  Tell your health care provider about any new moles or changes in moles, especially if there is a change in a mole's shape or color.  Also tell your health care provider if you have a mole that is larger than the size of a pencil eraser.  Always use sunscreen. Apply sunscreen liberally and repeatedly throughout the day.  Protect yourself by wearing long sleeves, pants, a wide-brimmed hat, and sunglasses whenever you are outside. HEART DISEASE, DIABETES, AND HIGH BLOOD PRESSURE   High blood pressure causes heart disease and increases the risk of stroke. High blood pressure is more likely to develop in:  People who have blood pressure in the high end  of the normal range (130-139/85-89 mm Hg).  People who are overweight or obese.  People who are African American.  If you are 38-23 years of age, have your blood pressure checked every 3-5 years. If you are 61 years of age or older, have your blood pressure checked every year. You should have your blood pressure measured twice--once when you are at a hospital or clinic, and once when you are not at a hospital or clinic. Record the average of the two measurements. To check your blood pressure when you are not at a hospital or clinic, you can use:  An automated blood pressure machine at a pharmacy.  A home blood pressure monitor.  If you are between 45 years and 39 years old, ask your health care provider if you should take aspirin to prevent strokes.  Have regular diabetes screenings. This involves taking a blood sample to check your fasting blood sugar level.  If you are at a normal weight and have a low risk for diabetes,  have this test once every three years after 52 years of age.  If you are overweight and have a high risk for diabetes, consider being tested at a younger age or more often. PREVENTING INFECTION  Hepatitis B  If you have a higher risk for hepatitis B, you should be screened for this virus. You are considered at high risk for hepatitis B if:  You were born in a country where hepatitis B is common. Ask your health care provider which countries are considered high risk.  Your parents were born in a high-risk country, and you have not been immunized against hepatitis B (hepatitis B vaccine).  You have HIV or AIDS.  You use needles to inject street drugs.  You live with someone who has hepatitis B.  You have had sex with someone who has hepatitis B.  You get hemodialysis treatment.  You take certain medicines for conditions, including cancer, organ transplantation, and autoimmune conditions. Hepatitis C  Blood testing is recommended for:  Everyone born from [birth year] through 1967.  Anyone with known risk factors for hepatitis C. Sexually transmitted infections (STIs)  You should be screened for sexually transmitted infections (STIs) including gonorrhea and chlamydia if:  You are sexually active and are younger than 52 years of age.  You are older than 53 years of age and your health care provider tells you that you are at risk for this type of infection.  Your sexual activity has changed since you were last screened and you are at an increased risk for chlamydia or gonorrhea. Ask your health care provider if you are at risk.  If you do not have HIV, but are at risk, it may be recommended that you take a prescription medicine daily to prevent HIV infection. This is called pre-exposure prophylaxis (PrEP). You are considered at risk if:  You are sexually active and do not regularly use condoms or know the HIV status of your partner(s).  You take drugs by injection.  You are sexually  active with a partner who has HIV. Talk with your health care provider about whether you are at high risk of being infected with HIV. If you choose to begin PrEP, you should first be tested for HIV. You should then be tested every 3 months for as long as you are taking PrEP.  PREGNANCY   If you are premenopausal and you may become pregnant, ask your health care provider about preconception counseling.  If you may  become pregnant, take 400 to 800 micrograms (mcg) of folic acid every day.  If you want to prevent pregnancy, talk to your health care provider about birth control (contraception). OSTEOPOROSIS AND MENOPAUSE   Osteoporosis is a disease in which the bones lose minerals and strength with aging. This can result in serious bone fractures. Your risk for osteoporosis can be identified using a bone density scan.  If you are 42 years of age or older, or if you are at risk for osteoporosis and fractures, ask your health care provider if you should be screened.  Ask your health care provider whether you should take a calcium or vitamin D supplement to lower your risk for osteoporosis.  Menopause may have certain physical symptoms and risks.  Hormone replacement therapy may reduce some of these symptoms and risks. Talk to your health care provider about whether hormone replacement therapy is right for you.  HOME CARE INSTRUCTIONS   Schedule regular health, dental, and eye exams.  Stay current with your immunizations.   Do not use any tobacco products including cigarettes, chewing tobacco, or electronic cigarettes.  If you are pregnant, do not drink alcohol.  If you are breastfeeding, limit how much and how often you drink alcohol.  Limit alcohol intake to no more than 1 drink per day for nonpregnant women. One drink equals 12 ounces of beer, 5 ounces of wine, or 1 ounces of hard liquor.  Do not use street drugs.  Do not share needles.  Ask your health care provider for help if  you need support or information about quitting drugs.  Tell your health care provider if you often feel depressed.  Tell your health care provider if you have ever been abused or do not feel safe at home.   This information is not intended to replace advice given to you by your health care provider. Make sure you discuss any questions you have with your health care provider.   Document Released: 10/29/2010 Document Revised: 05/06/2014 Document Reviewed: 03/17/2013 Elsevier Interactive Patient Education 2016 [place] Carbohydrate Counting for Diabetes Mellitus Carbohydrate counting is a method for keeping track of the amount of carbohydrates you eat. Eating carbohydrates naturally increases the level of sugar (glucose) in your blood, so it is important for you to know the amount that is okay for you to have in every meal. Carbohydrate counting helps keep the level of glucose in your blood within normal limits. The amount of carbohydrates allowed is different for every person. A dietitian can help you calculate the amount that is right for you. Once you know the amount of carbohydrates you can have, you can count the carbohydrates in the foods you want to eat. Carbohydrates are found in the following foods:  Grains, such as breads and cereals.  Dried beans and soy products.  Starchy vegetables, such as potatoes, peas, and corn.  Fruit and fruit juices.  Milk and yogurt.  Sweets and snack foods, such as cake, cookies, candy, chips, soft drinks, and fruit drinks. CARBOHYDRATE COUNTING There are two ways to count the carbohydrates in your food. You can use either of the methods or a combination of both. Reading the "Nutrition Facts" on [place] The "Nutrition Facts" is an area that is included on the labels of almost all packaged food and beverages in the [place]. It includes the serving size of that food or beverage and information about the nutrients in each serving of  the food, including the grams (g)  of carbohydrate per serving.  Decide the number of servings of this food or beverage that you will be able to eat or drink. Multiply that number of servings by the number of grams of carbohydrate that is listed on the label for that serving. The total will be the amount of carbohydrates you will be having when you eat or drink this food or beverage. Learning Standard Serving Sizes of Food When you eat food that is not packaged or does not include "Nutrition Facts" on the label, you need to measure the servings in order to count the amount of carbohydrates.A serving of most carbohydrate-rich foods contains about 15 g of carbohydrates. The following list includes serving sizes of carbohydrate-rich foods that provide 15 g ofcarbohydrate per serving:   1 slice of bread (1 oz) or 1 six-inch tortilla.    of a hamburger bun or English muffin.  4-6 crackers.   cup unsweetened dry cereal.    cup hot cereal.   cup rice or pasta.    cup mashed potatoes or  of a large baked potato.  1 cup fresh fruit or one small piece of fruit.    cup canned or frozen fruit or fruit juice.  1 cup milk.   cup plain fat-free yogurt or yogurt sweetened with artificial sweeteners.   cup cooked dried beans or starchy vegetable, such as peas, corn, or potatoes.  Decide the number of standard-size servings that you will eat. Multiply that number of servings by 15 (the grams of carbohydrates in that serving). For example, if you eat 2 cups of strawberries, you will have eaten 2 servings and 30 g of carbohydrates (2 servings x 15 g = 30 g). For foods such as soups and casseroles, in which more than one food is mixed in, you will need to count the carbohydrates in each food that is included. EXAMPLE OF CARBOHYDRATE COUNTING Sample Dinner  3 oz chicken breast.   cup of brown rice.   cup of corn.  1 cup milk.   1 cup strawberries with sugar-free whipped topping.   Carbohydrate Calculation Step 1: Identify the foods that contain carbohydrates:   Rice.   Corn.   Milk.   Strawberries. Step 2:Calculate the number of servings eaten of each:   2 servings of rice.   1 serving of corn.   1 serving of milk.   1 serving of strawberries. Step 3: Multiply each of those number of servings by 15 g:   2 servings of rice x 15 g = 30 g.   1 serving of corn x 15 g = 15 g.   1 serving of milk x 15 g = 15 g.   1 serving of strawberries x 15 g = 15 g. Step 4: Add together all of the amounts to find the total grams of carbohydrates eaten: 30 g + 15 g + 15 g + 15 g = 75 g.   This information is not intended to replace advice given to you by your health care provider. Make sure you discuss any questions you have with your health care provider.   Document Released: 04/15/2005 Document Revised: 05/06/2014 Document Reviewed: 03/12/2013 Elsevier Interactive Patient Education [employer].

## 2015-05-03 LAB — URINALYSIS W MICROSCOPIC + REFLEX CULTURE
Bacteria, UA: NONE SEEN [HPF]
Bilirubin Urine: NEGATIVE
Casts: NONE SEEN [LPF]
Crystals: NONE SEEN [HPF]
GLUCOSE, UA: NEGATIVE
Hgb urine dipstick: NEGATIVE
Ketones, ur: NEGATIVE
LEUKOCYTES UA: NEGATIVE
Nitrite: NEGATIVE
PH: 6.5 (ref 5.0–8.0)
Protein, ur: NEGATIVE
RBC / HPF: NONE SEEN RBC/HPF (ref <=2)
SPECIFIC GRAVITY, URINE: 1.019 (ref 1.001–1.035)
WBC UA: NONE SEEN WBC/HPF (ref <=5)
YEAST: NONE SEEN [HPF]

## 2015-05-04 LAB — CYTOLOGY - PAP

## 2015-05-23 ENCOUNTER — Other Ambulatory Visit: Payer: Self-pay | Admitting: Orthopedic Surgery

## 2015-05-23 DIAGNOSIS — M25562 Pain in left knee: Secondary | ICD-10-CM

## 2015-05-26 ENCOUNTER — Ambulatory Visit
Admission: RE | Admit: 2015-05-26 | Discharge: 2015-05-26 | Disposition: A | Discharge status: Home / Self Care | Payer: BC Managed Care – PPO | Source: Ambulatory Visit | Attending: Orthopedic Surgery | Admitting: Orthopedic Surgery

## 2015-05-26 DIAGNOSIS — M25562 Pain in left knee: Secondary | ICD-10-CM

## 2015-08-23 ENCOUNTER — Other Ambulatory Visit: Payer: Self-pay | Admitting: Women's Health

## 2015-08-23 ENCOUNTER — Telehealth: Payer: Self-pay

## 2015-08-23 MED ORDER — FLUCONAZOLE 150 MG PO TABS: 150.0000 mg | ORAL_TABLET | Freq: Once | ORAL | Status: DC

## 2015-08-23 NOTE — Telephone Encounter (Signed)
Okay for Diflucan 150 times one dose, office visit if no relief.

## 2015-08-23 NOTE — Telephone Encounter (Signed)
Rx sent patient informed  

## 2015-08-23 NOTE — Telephone Encounter (Signed)
Patient complained of vag yeast infection and is familiar with symptoms. Asking if you will send a Diflucan tab for her? Ok?

## 2015-09-06 ENCOUNTER — Emergency Department (HOSPITAL_BASED_OUTPATIENT_CLINIC_OR_DEPARTMENT_OTHER)
Admission: EM | Admit: 2015-09-06 | Discharge: 2015-09-06 | Disposition: A | Discharge status: Home / Self Care | Payer: BC Managed Care – PPO | Attending: Emergency Medicine | Admitting: Emergency Medicine

## 2015-09-06 ENCOUNTER — Encounter (HOSPITAL_BASED_OUTPATIENT_CLINIC_OR_DEPARTMENT_OTHER): Payer: Self-pay | Admitting: Emergency Medicine

## 2015-09-06 DIAGNOSIS — E119 Type 2 diabetes mellitus without complications: Secondary | ICD-10-CM | POA: Insufficient documentation

## 2015-09-06 DIAGNOSIS — I493 Ventricular premature depolarization: Secondary | ICD-10-CM

## 2015-09-06 DIAGNOSIS — R0981 Nasal congestion: Secondary | ICD-10-CM | POA: Diagnosis not present

## 2015-09-06 DIAGNOSIS — R5383 Other fatigue: Secondary | ICD-10-CM | POA: Insufficient documentation

## 2015-09-06 DIAGNOSIS — R197 Diarrhea, unspecified: Secondary | ICD-10-CM | POA: Diagnosis not present

## 2015-09-06 DIAGNOSIS — Z7984 Long term (current) use of oral hypoglycemic drugs: Secondary | ICD-10-CM | POA: Insufficient documentation

## 2015-09-06 LAB — URINE MICROSCOPIC-ADD ON

## 2015-09-06 LAB — URINALYSIS, ROUTINE W REFLEX MICROSCOPIC
Bilirubin Urine: NEGATIVE
GLUCOSE, UA: NEGATIVE mg/dL
Hgb urine dipstick: NEGATIVE
Ketones, ur: NEGATIVE mg/dL
NITRITE: NEGATIVE
PH: 7 (ref 5.0–8.0)
Protein, ur: NEGATIVE mg/dL
SPECIFIC GRAVITY, URINE: 1.021 (ref 1.005–1.030)

## 2015-09-06 LAB — CBC WITH DIFFERENTIAL/PLATELET
BASOS ABS: 0 10*3/uL (ref 0.0–0.1)
BASOS PCT: 0 %
EOS PCT: 0 %
Eosinophils Absolute: 0 10*3/uL (ref 0.0–0.7)
HCT: 39.4 % (ref 36.0–46.0)
Hemoglobin: 12.8 g/dL (ref 12.0–15.0)
Lymphocytes Relative: 30 %
Lymphs Abs: 2.2 10*3/uL (ref 0.7–4.0)
MCH: 26.4 pg (ref 26.0–34.0)
MCHC: 32.5 g/dL (ref 30.0–36.0)
MCV: 81.4 fL (ref 78.0–100.0)
MONO ABS: 0.4 10*3/uL (ref 0.1–1.0)
MONOS PCT: 5 %
Neutro Abs: 4.9 10*3/uL (ref 1.7–7.7)
Neutrophils Relative %: 65 %
Platelets: 260 10*3/uL (ref 150–400)
RBC: 4.84 MIL/uL (ref 3.87–5.11)
RDW: 13.8 % (ref 11.5–15.5)
WBC: 7.5 10*3/uL (ref 4.0–10.5)

## 2015-09-06 LAB — BASIC METABOLIC PANEL
ANION GAP: 3 — AB (ref 5–15)
BUN: 11 mg/dL (ref 6–20)
CHLORIDE: 105 mmol/L (ref 101–111)
CO2: 26 mmol/L (ref 22–32)
CREATININE: 0.72 mg/dL (ref 0.44–1.00)
Calcium: 8.8 mg/dL — ABNORMAL LOW (ref 8.9–10.3)
GFR calc non Af Amer: >60 mL/min (ref >60)
GLUCOSE: 144 mg/dL — AB (ref 65–99)
Potassium: 3.9 mmol/L (ref 3.5–5.1)
Sodium: 134 mmol/L — ABNORMAL LOW (ref 135–145)

## 2015-09-06 LAB — CBG MONITORING, ED: GLUCOSE-CAPILLARY: 153 mg/dL — AB (ref 65–99)

## 2015-09-06 LAB — PREGNANCY, URINE: Preg Test, Ur: NEGATIVE

## 2015-09-06 MED ORDER — SODIUM CHLORIDE 0.9 % IV BOLUS (SEPSIS)
1000.0000 mL | Freq: Once | INTRAVENOUS | Status: AC
  Administered 2015-09-06: 1000 mL via INTRAVENOUS

## 2015-09-06 NOTE — ED Notes (Signed)
PA at the bedside.

## 2015-09-06 NOTE — ED Notes (Addendum)
Patient reports increased fatigue over the past few days.  Reports 1 episode of vomiting on arrival to ED.  Reports 3 episodes of diarrhea this morning.  Denies fever.  Reports she began feeling somewhat dizzy this morning.

## 2015-09-06 NOTE — Discharge Instructions (Signed)
Please read and follow all provided instructions.  Your diagnoses today include:  1. Other fatigue   2. Frequent unifocal PVCs    Tests performed today include:  Vital signs. See below for your results today.   Blood counts and electrolytes - no anemia or other problems  Urine test - no infection  EKG - normal, however you're having frequent skipped heartbeats, called premature ventricular contractions on the cardiac monitor  Medications prescribed:   None  Take any prescribed medications only as directed.  Home care instructions:  Follow any educational materials contained in this packet.  BE VERY CAREFUL not to take multiple medicines containing Tylenol (also called acetaminophen). Doing so can lead to an overdose which can damage your liver and cause liver failure and possibly death.   Follow-up instructions: Please follow-up with your primary care provider in the next 3 days for further evaluation of your symptoms.   Return instructions:   Please return to the Emergency Department if you experience worsening symptoms.   Please return if you have any other emergent concerns.  Additional Information:  Your vital signs today were: BP 151/90 mmHg   Pulse 93   Temp(Src) 98.1 F (36.7 C) (Oral)   Resp 18   Ht 5\' 5"  (1.651 m)   Wt 111.131 kg   BMI 40.77 kg/m2   SpO2 97% If your blood pressure (BP) was elevated above 135/85 this visit, please have this repeated by your doctor within one month. --------------

## 2015-09-06 NOTE — ED Provider Notes (Signed)
CSN: [id number]     Arrival date & time 09/06/15  [id number] History   First MD Initiated Contact with Patient 09/06/15 [phone]     Chief Complaint  Patient presents with  . Fatigue    (Consider location/radiation/quality/duration/timing/severity/associated sxs/prior Treatment) HPI Comments: Patient with history of diabetes on metformin, high blood pressure presents with complaint of generalized fatigue for the past several days. Patient states that she has been under a lot of stress recently from a funeral and activities. She has felt very fatigued for the past 3 days but symptoms are worse this morning prompting ED visit. Patient had nausea, one episode of vomiting upon arrival to the emergency department, and several poorly formed stools without blood this morning. No urinary symptoms. Patient has had some congestion but no headache or other URI symptoms. She denies chest pain, shortness of breath, or abdominal pain at any time. Recent occasional cough but not pronounced. No history of anemia. She feels that she has been eating and drinking well. Denies tick bite or rash. The onset of this condition was acute. The course is constant. Aggravating factors: none. Alleviating factors: none.    The history is provided by the patient.    Past Medical History  Diagnosis Date  . Fibroid    Past Surgical History  Procedure Laterality Date  . Intrauterine device insertion  01/06/2006    MIRENA  . Cholecystectomy    . Knee surgery      RIGHT  . Hand surgery      RIGHT   Family History  Problem Relation Age of Onset  . Diabetes Mother   . Hypertension Mother   . Cancer Mother     UTERINE  . Heart disease Mother   . Diabetes Sister   . Hypertension Sister   . Breast cancer Sister   . Diabetes Brother   . Cancer Brother     LUNG/LIVER...DECEASED Aug 23, 2009  . Hypertension Brother    Social History  Substance Use Topics  . Smoking status: Never Smoker   . Smokeless tobacco: Never Used  . Alcohol  Use: Yes     Comment: OCC   OB History    Gravida Para Term Preterm AB TAB SAB Ectopic Multiple Living   0              Review of Systems  Constitutional: Positive for fatigue. Negative for fever, chills and appetite change.  HENT: Positive for congestion. Negative for ear pain, rhinorrhea, sinus pressure and sore throat.   Eyes: Negative for redness.  Respiratory: Positive for cough (occasional). Negative for wheezing.   Gastrointestinal: Positive for nausea, vomiting and diarrhea. Negative for abdominal pain and blood in stool.  Endocrine: Negative for polydipsia.  Genitourinary: Negative for dysuria.  Musculoskeletal: Negative for myalgias and neck stiffness.  Skin: Negative for rash.  Neurological: Negative for headaches.  Hematological: Negative for adenopathy.      Allergies  Other  Home Medications   Prior to Admission medications   Medication Sig Start Date End Date Taking? Authorizing Provider  cholecalciferol (VITAMIN D) 1000 UNITS tablet Take 1,000 Units by mouth 2 (two) times daily.      Historical Provider, MD  fish oil-omega-3 fatty acids 1000 MG capsule Take 2 g by mouth daily.      Historical Provider, MD  fluconazole (DIFLUCAN) 150 MG tablet Take 1 tablet (150 mg total) by mouth once. 08/23/15   Huel Cote, NP  losartan (COZAAR) 25 MG tablet Take 25 mg  by mouth daily.    Historical Provider, MD  magnesium citrate SOLN As directed    Historical Provider, MD  metFORMIN (GLUCOPHAGE-XR) 500 MG 24 hr tablet Take 1 tablet by mouth daily. 04/05/15   Historical Provider, MD  montelukast (SINGULAIR) 10 MG tablet Take 10 mg by mouth at bedtime.      Historical Provider, MD  Multiple Vitamin (MULTIVITAMIN) tablet Take 1 tablet by mouth daily.      Historical Provider, MD  Red Yeast Rice Extract 600 MG CAPS Take 1 tablet by mouth daily.    Historical Provider, MD  WELCHOL 3.75 g PACK Take 1 tablet by mouth daily. 04/06/15   Historical Provider, MD  zolpidem (AMBIEN) 5 MG  tablet Take 5 mg by mouth at bedtime as needed.      Historical Provider, MD   BP 151/90 mmHg  Pulse 93  Temp(Src) 98.1 F (36.7 C) (Oral)  Resp 18  Ht 5\' 5"  (1.651 m)  Wt 111.131 kg  BMI 40.77 kg/m2  SpO2 97%   Physical Exam  Constitutional: She appears well-developed and well-nourished.  HENT:  Head: Normocephalic and atraumatic.  Right Ear: Tympanic membrane, external ear and ear canal normal.  Left Ear: Tympanic membrane, external ear and ear canal normal.  Nose: No mucosal edema or rhinorrhea.  Mouth/Throat: No oropharyngeal exudate, posterior oropharyngeal edema or posterior oropharyngeal erythema.  Eyes: Conjunctivae are normal. Right eye exhibits no discharge. Left eye exhibits no discharge.  Neck: Normal range of motion. Neck supple.  Cardiovascular: Normal rate, regular rhythm and normal heart sounds.   No murmur heard. Pulmonary/Chest: Effort normal and breath sounds normal. No respiratory distress. She has no wheezes. She has no rales.  Abdominal: Soft. There is no tenderness. There is no rebound and no guarding.  Musculoskeletal: She exhibits no edema or tenderness.  Neurological: She is alert.  Skin: Skin is warm and dry.  Psychiatric: She has a normal mood and affect.  Nursing note and vitals reviewed.   ED Course  Procedures (including critical care time) Labs Review Labs Reviewed  BASIC METABOLIC PANEL - Abnormal; Notable for the following:    Sodium 134 (*)    Glucose, Bld 144 (*)    Calcium 8.8 (*)    Anion gap 3 (*)    All other components within normal limits  URINALYSIS, ROUTINE W REFLEX MICROSCOPIC (NOT AT [facility]) - Abnormal; Notable for the following:    APPearance CLOUDY (*)    Leukocytes, UA TRACE (*)    All other components within normal limits  URINE MICROSCOPIC-ADD ON - Abnormal; Notable for the following:    Squamous Epithelial / LPF 0-5 (*)    Bacteria, UA MANY (*)    All other components within normal limits  CBG MONITORING, ED -  Abnormal; Notable for the following:    Glucose-Capillary 153 (*)    All other components within normal limits  CBC WITH DIFFERENTIAL/PLATELET  PREGNANCY, URINE     EKG Interpretation   Date/Time:  Wednesday Sep 06 2015 10:03:06 EDT Ventricular Rate:  75 PR Interval:  187 QRS Duration: 86 QT Interval:  397 QTC Calculation: 443 R Axis:   65 Text Interpretation:  Sinus rhythm Baseline wander in lead(s) V3 Confirmed  by ZACKOWSKI  MD, SCOTT ([id number]) on 09/06/2015 10:16:46 AM       9:49 AM Patient seen and examined. Work-up initiated. Fluids ordered.   Vital signs reviewed and are as follows: BP 151/90 mmHg  Pulse 93  Temp(Src) 98.1  F (36.7 C) (Oral)  Resp 18  Ht 5\' 5"  (1.651 m)  Wt 111.131 kg  BMI 40.77 kg/m2  SpO2 97%  11:47 AM Patient states that she is feeling a bit better after fluids here we discussed her lab results.  She feels comfortable with follow-up with her primary care physician. No indications for admission at this time.  Encouraged her to rest for the next several days and hydrate well. Encouraged to return with new or changing symptoms including chest pain, abdominal pain, fevers, persistent vomiting. Patient verbalizes understanding and agrees with plan.  MDM   Final diagnoses:  Other fatigue  Frequent unifocal PVCs   Patient presents with generalized fatigue, nausea and vomiting today as well as some diarrhea. This may represent gastroenteritis. Vomiting is controlled without treatment. Patient also seems to have recent emotional stressor. Labs today are reassuring. Patient states that she was told about frequent PVCs in the past. I doubt this is contributing to her current symptoms. She is encouraged follow-up with her primary care doctor regarding this.    Carlisle Cater, PA-C 09/06/15 1150  Fredia Sorrow, MD 09/06/15 1453

## 2016-04-08 ENCOUNTER — Encounter: Payer: Self-pay | Admitting: Women's Health

## 2016-04-18 ENCOUNTER — Telehealth: Payer: Self-pay | Admitting: *Deleted

## 2016-04-18 NOTE — Telephone Encounter (Signed)
Pt called c/o left side pelvic discomfort notes x 1 week prior to last cycle, which was 04/04/16 lasted 7 days doesn't have monthly cycles. Takes OTC ibuprofen which help with discomfort, notes discomfort mainly in the evening. Then off and on during the day. Patient aware office visit needed questioned if ultrasound should be schedule in advice or would you like to exam first then schedule ultrasound at later date? Please advise

## 2016-04-18 NOTE — Telephone Encounter (Signed)
TC  has had one cycle in April and 1 in December. Continue over-the-counter Motrin for discomfort, denies urinary symptoms, vaginal discharge. Does have a new partner will check  STD screening at annual exam schedule 05/01/2016. History of fibroids.

## 2016-05-02 ENCOUNTER — Encounter: Payer: Self-pay | Admitting: Women's Health

## 2016-05-02 ENCOUNTER — Ambulatory Visit (INDEPENDENT_AMBULATORY_CARE_PROVIDER_SITE_OTHER): Payer: BC Managed Care – PPO | Admitting: Women's Health

## 2016-05-02 VITALS — BP 128/78 | Ht 64.0 in | Wt 263.0 lb

## 2016-05-02 DIAGNOSIS — I10 Essential (primary) hypertension: Secondary | ICD-10-CM | POA: Diagnosis not present

## 2016-05-02 DIAGNOSIS — D259 Leiomyoma of uterus, unspecified: Secondary | ICD-10-CM | POA: Diagnosis not present

## 2016-05-02 DIAGNOSIS — Z01419 Encounter for gynecological examination (general) (routine) without abnormal findings: Secondary | ICD-10-CM | POA: Diagnosis not present

## 2016-05-02 NOTE — Patient Instructions (Signed)
Bariatric Surgery Information Bariatric surgery, also called weight loss surgery, is a procedure that helps you lose weight. You may consider or your health care provider may suggest bariatric surgery if:  You are severely obese and have been unable to lose weight through diet and exercise.  You have health problems related to obesity, such as:  Type 2 diabetes.  Heart disease.  Lung disease. How does bariatric surgery help me lose weight? Bariatric surgery helps you lose weight by decreasing how much food your body absorbs. This is done by closing off part of your stomach to make it smaller. This restricts the amount of food your stomach can hold. Bariatric surgery can also change your body's regular digestive process, so that food bypasses the parts of your body that absorb calories and nutrients. If you decide to have bariatric surgery, it is important to continue to eat a healthy diet and exercise regularly after the surgery. What are the different kinds of bariatric surgery? There are two kinds of bariatric surgeries:  Restrictive surgeries make your stomach smaller. They do not change your digestive process. The smaller the size of your new stomach, the less food you can eat. There are different types of restrictive surgeries.  Malabsorptive surgeries both make your stomach smaller and alter your digestive process so that your body processes less calories and nutrients. These are the most common kind of bariatric surgery. There are different types of malabsorptive surgeries. What are the different types of restrictive surgery? Adjustable Gastric Banding  In this procedure, an inflatable band is placed around your stomach near the upper end. This makes the passageway for food into the rest of your stomach much smaller. The band can be adjusted, making it tighter or looser, by filling it with salt solution. Your surgeon can adjust the band based on how are you feeling and how much weight  you are losing. The band can be removed in the future. Vertical Banded Gastroplasty  In this procedure, staples are used to separate your stomach into two parts, a small upper pouch and a bigger lower pouch. This decreases how much food you can eat. Sleeve Gastrectomy  In this procedure, your stomach is made smaller. This is done by surgically removing a large part of your stomach. When your stomach is smaller, you feel full more quickly and reduce how much you eat. What are the different types of malabsorptive surgery? Roux-en-Y Gastric Bypass (RGB)  This is the most common weight loss surgery. In this procedure, a small stomach pouch is created in the upper part of your stomach. Next, this small stomach pouch is attached directly to the middle part of your small intestine. The farther down your small intestine the new connection is made, the fewer calories and nutrients you will absorb. Biliopancreatic Diversion with Duodenal Switch (BPD/DS)  This is a multi-step procedure. In this procedure, a large part of your stomach is removed, making your stomach smaller. Next, this smaller stomach is attached to the lower part of your small intestine. Like the RGB surgery, you absorb fewer calories and nutrients the farther down your small intestine the attachment is made. What are the risks of bariatric surgery? As with any surgical procedure, each type of bariatric surgery has its own risks. These risks also depend on your age, your overall health, and any other medical conditions you may have. When deciding on bariatric surgery, it is very important to:  Talk to your health care provider and choose the surgery that  is best for you.  Ask your health care provider about specific risks for the surgery you choose. Where to find more information:  American Society for Metabolic & Bariatric Surgery: www.asmbs.org  Weight-control Information Network (WIN): win.AmenCredit.is This information is not intended  to replace advice given to you by your health care provider. Make sure you discuss any questions you have with your health care provider. Document Released: 04/15/2005 Document Revised: 09/21/2015 Document Reviewed: 10/14/2012 Elsevier Interactive Patient Education  2017 [place] Maintenance, Female Introduction Adopting a healthy lifestyle and getting preventive care can go a long way to promote health and wellness. Talk with your health care provider about what schedule of regular examinations is right for you. This is a good chance for you to check in with your provider about disease prevention and staying healthy. In between checkups, there are plenty of things you can do on your own. Experts have done a lot of research about which lifestyle changes and preventive measures are most likely to keep you healthy. Ask your health care provider for more information. Weight and diet Eat a healthy diet  Be sure to include plenty of vegetables, fruits, low-fat dairy products, and lean protein.  Do not eat a lot of foods high in solid fats, added sugars, or salt.  Get regular exercise. This is one of the most important things you can do for your health.  Most adults should exercise for at least 150 minutes each week. The exercise should increase your heart rate and make you sweat (moderate-intensity exercise).  Most adults should also do strengthening exercises at least twice a week. This is in addition to the moderate-intensity exercise. Maintain a healthy weight  Body mass index (BMI) is a measurement that can be used to identify possible weight problems. It estimates body fat based on height and weight. Your health care provider can help determine your BMI and help you achieve or maintain a healthy weight.  For females 2 years of age and older:  A BMI below 18.5 is considered underweight.  A BMI of 18.5 to 24.9 is normal.  A BMI of 25 to 29.9 is considered overweight.  A BMI  of 30 and above is considered obese. Watch levels of cholesterol and blood lipids  You should start having your blood tested for lipids and cholesterol at 52 years of age, then have this test every 5 years.  You may need to have your cholesterol levels checked more often if:  Your lipid or cholesterol levels are high.  You are older than 53 years of age.  You are at high risk for heart disease. Cancer screening Lung Cancer  Lung cancer screening is recommended for adults 37-31 years old who are at high risk for lung cancer because of a history of smoking.  A yearly low-dose CT scan of the lungs is recommended for people who:  Currently smoke.  Have quit within the past 15 years.  Have at least a 30-pack-year history of smoking. A pack year is smoking an average of one pack of cigarettes a day for 1 year.  Yearly screening should continue until it has been 15 years since you quit.  Yearly screening should stop if you develop a health problem that would prevent you from having lung cancer treatment. Breast Cancer  Practice breast self-awareness. This means understanding how your breasts normally appear and feel.  It also means doing regular breast self-exams. Let your health care provider know about any changes, no  matter how small.  If you are in your 20s or 30s, you should have a clinical breast exam (CBE) by a health care provider every 1-3 years as part of a regular health exam.  If you are 28 or older, have a CBE every year. Also consider having a breast X-ray (mammogram) every year.  If you have a family history of breast cancer, talk to your health care provider about genetic screening.  If you are at high risk for breast cancer, talk to your health care provider about having an MRI and a mammogram every year.  Breast cancer gene (BRCA) assessment is recommended for women who have family members with BRCA-related cancers. BRCA-related cancers  include:  Breast.  Ovarian.  Tubal.  Peritoneal cancers.  Results of the assessment will determine the need for genetic counseling and BRCA1 and BRCA2 testing. Cervical Cancer  Your health care provider may recommend that you be screened regularly for cancer of the pelvic organs (ovaries, uterus, and vagina). This screening involves a pelvic examination, including checking for microscopic changes to the surface of your cervix (Pap test). You may be encouraged to have this screening done every 3 years, beginning at age 76.  For women ages 59-65, health care providers may recommend pelvic exams and Pap testing every 3 years, or they may recommend the Pap and pelvic exam, combined with testing for human papilloma virus (HPV), every 5 years. Some types of HPV increase your risk of cervical cancer. Testing for HPV may also be done on women of any age with unclear Pap test results.  Other health care providers may not recommend any screening for nonpregnant women who are considered low risk for pelvic cancer and who do not have symptoms. Ask your health care provider if a screening pelvic exam is right for you.  If you have had past treatment for cervical cancer or a condition that could lead to cancer, you need Pap tests and screening for cancer for at least 20 years after your treatment. If Pap tests have been discontinued, your risk factors (such as having a new sexual partner) need to be reassessed to determine if screening should resume. Some women have medical problems that increase the chance of getting cervical cancer. In these cases, your health care provider may recommend more frequent screening and Pap tests. Colorectal Cancer  This type of cancer can be detected and often prevented.  Routine colorectal cancer screening usually begins at 53 years of age and continues through 53 years of age.  Your health care provider may recommend screening at an earlier age if you have risk factors  for colon cancer.  Your health care provider may also recommend using home test kits to check for hidden blood in the stool.  A small camera at the end of a tube can be used to examine your colon directly (sigmoidoscopy or colonoscopy). This is done to check for the earliest forms of colorectal cancer.  Routine screening usually begins at age 36.  Direct examination of the colon should be repeated every 5-10 years through 53 years of age. However, you may need to be screened more often if early forms of precancerous polyps or small growths are found. Skin Cancer  Check your skin from head to toe regularly.  Tell your health care provider about any new moles or changes in moles, especially if there is a change in a mole's shape or color.  Also tell your health care provider if you have a mole  that is larger than the size of a pencil eraser.  Always use sunscreen. Apply sunscreen liberally and repeatedly throughout the day.  Protect yourself by wearing long sleeves, pants, a wide-brimmed hat, and sunglasses whenever you are outside. Heart disease, diabetes, and high blood pressure  High blood pressure causes heart disease and increases the risk of stroke. High blood pressure is more likely to develop in:  People who have blood pressure in the high end of the normal range (130-139/85-89 mm Hg).  People who are overweight or obese.  People who are African American.  If you are 28-31 years of age, have your blood pressure checked every 3-5 years. If you are 32 years of age or older, have your blood pressure checked every year. You should have your blood pressure measured twice-once when you are at a hospital or clinic, and once when you are not at a hospital or clinic. Record the average of the two measurements. To check your blood pressure when you are not at a hospital or clinic, you can use:  An automated blood pressure machine at a pharmacy.  A home blood pressure monitor.  If you  are between 60 years and 4 years old, ask your health care provider if you should take aspirin to prevent strokes.  Have regular diabetes screenings. This involves taking a blood sample to check your fasting blood sugar level.  If you are at a normal weight and have a low risk for diabetes, have this test once every three years after 53 years of age.  If you are overweight and have a high risk for diabetes, consider being tested at a younger age or more often. Preventing infection Hepatitis B  If you have a higher risk for hepatitis B, you should be screened for this virus. You are considered at high risk for hepatitis B if:  You were born in a country where hepatitis B is common. Ask your health care provider which countries are considered high risk.  Your parents were born in a high-risk country, and you have not been immunized against hepatitis B (hepatitis B vaccine).  You have HIV or AIDS.  You use needles to inject street drugs.  You live with someone who has hepatitis B.  You have had sex with someone who has hepatitis B.  You get hemodialysis treatment.  You take certain medicines for conditions, including cancer, organ transplantation, and autoimmune conditions. Hepatitis C  Blood testing is recommended for:  Everyone born from [birth year] through 1967.  Anyone with known risk factors for hepatitis C. Sexually transmitted infections (STIs)  You should be screened for sexually transmitted infections (STIs) including gonorrhea and chlamydia if:  You are sexually active and are younger than 53 years of age.  You are older than 53 years of age and your health care provider tells you that you are at risk for this type of infection.  Your sexual activity has changed since you were last screened and you are at an increased risk for chlamydia or gonorrhea. Ask your health care provider if you are at risk.  If you do not have HIV, but are at risk, it may be recommended that you  take a prescription medicine daily to prevent HIV infection. This is called pre-exposure prophylaxis (PrEP). You are considered at risk if:  You are sexually active and do not regularly use condoms or know the HIV status of your partner(s).  You take drugs by injection.  You are sexually active with  a partner who has HIV. Talk with your health care provider about whether you are at high risk of being infected with HIV. If you choose to begin PrEP, you should first be tested for HIV. You should then be tested every 3 months for as long as you are taking PrEP. Pregnancy  If you are premenopausal and you may become pregnant, ask your health care provider about preconception counseling.  If you may become pregnant, take 400 to 800 micrograms (mcg) of folic acid every day.  If you want to prevent pregnancy, talk to your health care provider about birth control (contraception). Osteoporosis and menopause  Osteoporosis is a disease in which the bones lose minerals and strength with aging. This can result in serious bone fractures. Your risk for osteoporosis can be identified using a bone density scan.  If you are 25 years of age or older, or if you are at risk for osteoporosis and fractures, ask your health care provider if you should be screened.  Ask your health care provider whether you should take a calcium or vitamin D supplement to lower your risk for osteoporosis.  Menopause may have certain physical symptoms and risks.  Hormone replacement therapy may reduce some of these symptoms and risks. Talk to your health care provider about whether hormone replacement therapy is right for you. Follow these instructions at home:  Schedule regular health, dental, and eye exams.  Stay current with your immunizations.  Do not use any tobacco products including cigarettes, chewing tobacco, or electronic cigarettes.  If you are pregnant, do not drink alcohol.  If you are breastfeeding, limit  how much and how often you drink alcohol.  Limit alcohol intake to no more than 1 drink per day for nonpregnant women. One drink equals 12 ounces of beer, 5 ounces of wine, or 1 ounces of hard liquor.  Do not use street drugs.  Do not share needles.  Ask your health care provider for help if you need support or information about quitting drugs.  Tell your health care provider if you often feel depressed.  Tell your health care provider if you have ever been abused or do not feel safe at home. This information is not intended to replace advice given to you by your health care provider. Make sure you discuss any questions you have with your health care provider. Document Released: 10/29/2010 Document Revised: 09/21/2015 Document Reviewed: 01/17/2015  2017 Elsevier

## 2016-05-02 NOTE — Progress Notes (Signed)
Cassidy Giles August 17, 1965 [id number]    History:    Presents for annual exam. Regular monthly cycles until age 51, 44 cycles were every other month this past year has had a cycle in April and then in December. Minimal menopausal symptoms. Pap 2017 was normal with positive HR HPV negative for 14, 16 and 43. Same partner. History of fibroid uterus. Normal mammograms. Sister breast cancer age 38 survivor. History of a pelvic kidney. Hypertension and diabetes managed by primary care and Dr. Chalmers Cater. 2016 benign colon polyps 5. Follow-up. History of a pelvic kidney.  Past medical history, past surgical history, family history and social history were all reviewed and documented in the EPIC chart. 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 Mother diabetes hypertension uterine cancer survivor died this past year from heart disease.  ROS:  A ROS was performed and pertinent positives and negatives are included.  Exam:  Vitals:   05/02/16 0836  BP: 128/78  Weight: 263 lb (119.3 kg)  Height: 5\' 4"  (1.626 m)   Body mass index is 45.14 kg/m.   General appearance:  Normal Thyroid:  Symmetrical, normal in size, without palpable masses or nodularity. Respiratory  Auscultation:  Clear without wheezing or rhonchi Cardiovascular  Auscultation:  Regular rate, without rubs, murmurs or gallops  Edema/varicosities:  Not grossly evident Abdominal  Soft,nontender, without masses, guarding or rebound.  Liver/spleen:  No organomegaly noted  Hernia:  None appreciated  Skin  Inspection:  Grossly normal   Breasts: Examined lying and sitting.     Right: Without masses, retractions, discharge or axillary adenopathy.     Left: Without masses, retractions, discharge or axillary adenopathy. Gentitourinary   Inguinal/mons:  Normal without inguinal adenopathy  External genitalia:  Normal  BUS/Urethra/Skene's glands:  Normal  Vagina:  Normal  Cervix:  Normal  Uterus:  Enlarged fibroid uterus, shape and contour.  Midline  and mobile  Adnexa/parametria:     Rt: Without masses or tenderness.   Lt: Without masses or tenderness.  Anus and perineum: Normal  Digital rectal exam: Normal sphincter tone without palpated masses or tenderness  Assessment/Plan:  51 y.o.  SBF G0 for annual exam with complaint of questionable left ovarian cyst, intermittent lower quadrant pain  Perimenopausal Hypertension-primary care manages labs and meds Diabetes endocrinologist manages meds  Fibroid uterus Obesity  Plan:  Pap with HR HPV typing. Ultrasound instructed to schedule. SBE's, continue annual 3-D screening mammogram, calcium rich diet, vitamin D 1000 daily encouraged. Reviewed importance of exercise and decreasing calories for weight loss. Menopause reviewed.     Huel Cote [facility], 9:33 AM 05/02/2016

## 2016-05-02 NOTE — Addendum Note (Signed)
Addended by: Burnett Kanaris on: 05/02/2016 10:52 AM   Modules accepted: Orders

## 2016-05-06 LAB — PAP, TP IMAGING W/ HPV RNA, RFLX HPV TYPE 16,18/45: HPV MRNA, HIGH RISK: NOT DETECTED

## 2016-05-15 ENCOUNTER — Other Ambulatory Visit: Payer: BC Managed Care – PPO

## 2016-05-15 ENCOUNTER — Ambulatory Visit: Payer: BC Managed Care – PPO | Admitting: Women's Health

## 2016-09-11 ENCOUNTER — Encounter: Payer: Self-pay | Admitting: Gynecology

## 2017-04-09 ENCOUNTER — Encounter: Payer: Self-pay | Admitting: Women's Health

## 2017-05-21 ENCOUNTER — Ambulatory Visit: Payer: BC Managed Care – PPO | Admitting: Women's Health

## 2017-05-21 ENCOUNTER — Encounter: Payer: Self-pay | Admitting: Women's Health

## 2017-05-21 VITALS — BP 132/80 | Ht 64.0 in | Wt 264.0 lb

## 2017-05-21 DIAGNOSIS — N760 Acute vaginitis: Secondary | ICD-10-CM | POA: Diagnosis not present

## 2017-05-21 DIAGNOSIS — B9689 Other specified bacterial agents as the cause of diseases classified elsewhere: Secondary | ICD-10-CM

## 2017-05-21 DIAGNOSIS — N898 Other specified noninflammatory disorders of vagina: Secondary | ICD-10-CM

## 2017-05-21 DIAGNOSIS — Z01419 Encounter for gynecological examination (general) (routine) without abnormal findings: Secondary | ICD-10-CM | POA: Diagnosis not present

## 2017-05-21 LAB — WET PREP FOR TRICH, YEAST, CLUE

## 2017-05-21 MED ORDER — METRONIDAZOLE 500 MG PO TABS: 500.0000 mg | ORAL_TABLET | Freq: Two times a day (BID) | ORAL | 0 refills | Status: DC

## 2017-05-21 NOTE — Patient Instructions (Signed)
Carbohydrate Counting for Diabetes Mellitus, Adult Carbohydrate counting is a method for keeping track of how many carbohydrates you eat. Eating carbohydrates naturally increases the amount of sugar (glucose) in the blood. Counting how many carbohydrates you eat helps keep your blood glucose within normal limits, which helps you manage your diabetes (diabetes mellitus). It is important to know how many carbohydrates you can safely have in each meal. This is different for every person. A diet and nutrition specialist (registered dietitian) can help you make a meal plan and calculate how many carbohydrates you should have at each meal and snack. Carbohydrates are found in the following foods:  Grains, such as breads and cereals.  Dried beans and soy products.  Starchy vegetables, such as potatoes, peas, and corn.  Fruit and fruit juices.  Milk and yogurt.  Sweets and snack foods, such as cake, cookies, candy, chips, and soft drinks.  How do I count carbohydrates? There are two ways to count carbohydrates in food. You can use either of the methods or a combination of both. Reading "Nutrition Facts" on packaged food The "Nutrition Facts" list is included on the labels of almost all packaged foods and beverages in the [place] It includes:  The serving size.  Information about nutrients in each serving, including the grams (g) of carbohydrate per serving.  To use the "Nutrition Facts":  Decide how many servings you will have.  Multiply the number of servings by the number of carbohydrates per serving.  The resulting number is the total amount of carbohydrates that you will be having.  Learning standard serving sizes of other foods When you eat foods containing carbohydrates that are not packaged or do not include "Nutrition Facts" on the label, you need to measure the servings in order to count the amount of carbohydrates:  Measure the foods that you will eat with a food scale or  measuring cup, if needed.  Decide how many standard-size servings you will eat.  Multiply the number of servings by 15. Most carbohydrate-rich foods have about 15 g of carbohydrates per serving. ? For example, if you eat 8 oz (170 g) of strawberries, you will have eaten 2 servings and 30 g of carbohydrates (2 servings x 15 g = 30 g).  For foods that have more than one food mixed, such as soups and casseroles, you must count the carbohydrates in each food that is included.  The following list contains standard serving sizes of common carbohydrate-rich foods. Each of these servings has about 15 g of carbohydrates:   hamburger bun or  English muffin.   oz (15 mL) syrup.   oz (14 g) jelly.  1 slice of bread.  1 six-inch tortilla.  3 oz (85 g) cooked rice or pasta.  4 oz (113 g) cooked dried beans.  4 oz (113 g) starchy vegetable, such as peas, corn, or potatoes.  4 oz (113 g) hot cereal.  4 oz (113 g) mashed potatoes or  of a large baked potato.  4 oz (113 g) canned or frozen fruit.  4 oz (120 mL) fruit juice.  4-6 crackers.  6 chicken nuggets.  6 oz (170 g) unsweetened dry cereal.  6 oz (170 g) plain fat-free yogurt or yogurt sweetened with artificial sweeteners.  8 oz (240 mL) milk.  8 oz (170 g) fresh fruit or one small piece of fruit.  24 oz (680 g) popped popcorn.  Example of carbohydrate counting Sample meal  3 oz (85 g) chicken breast.    6 oz (170 g) brown rice.  4 oz (113 g) corn.  8 oz (240 mL) milk.  8 oz (170 g) strawberries with sugar-free whipped topping. Carbohydrate calculation 1. Identify the foods that contain carbohydrates: ? Rice. ? Corn. ? Milk. ? Strawberries. 2. Calculate how many servings you have of each food: ? 2 servings rice. ? 1 serving corn. ? 1 serving milk. ? 1 serving strawberries. 3. Multiply each number of servings by 15 g: ? 2 servings rice x 15 g = 30 g. ? 1 serving corn x 15 g = 15 g. ? 1 serving milk x 15  g = 15 g. ? 1 serving strawberries x 15 g = 15 g. 4. Add together all of the amounts to find the total grams of carbohydrates eaten: ? 30 g + 15 g + 15 g + 15 g = 75 g of carbohydrates total. This information is not intended to replace advice given to you by your health care provider. Make sure you discuss any questions you have with your health care provider. Document Released: 04/15/2005 Document Revised: 11/03/2015 Document Reviewed: 09/27/2015 Elsevier Interactive Patient Education  2018 [place] Maintenance, Female Adopting a healthy lifestyle and getting preventive care can go a long way to promote health and wellness. Talk with your health care provider about what schedule of regular examinations is right for you. This is a good chance for you to check in with your provider about disease prevention and staying healthy. In between checkups, there are plenty of things you can do on your own. Experts have done a lot of research about which lifestyle changes and preventive measures are most likely to keep you healthy. Ask your health care provider for more information. Weight and diet Eat a healthy diet  Be sure to include plenty of vegetables, fruits, low-fat dairy products, and lean protein.  Do not eat a lot of foods high in solid fats, added sugars, or salt.  Get regular exercise. This is one of the most important things you can do for your health. ? Most adults should exercise for at least 150 minutes each week. The exercise should increase your heart rate and make you sweat (moderate-intensity exercise). ? Most adults should also do strengthening exercises at least twice a week. This is in addition to the moderate-intensity exercise.  Maintain a healthy weight  Body mass index (BMI) is a measurement that can be used to identify possible weight problems. It estimates body fat based on height and weight. Your health care provider can help determine your BMI and help you  achieve or maintain a healthy weight.  For females 76 years of age and older: ? A BMI below 18.5 is considered underweight. ? A BMI of 18.5 to 24.9 is normal. ? A BMI of 25 to 29.9 is considered overweight. ? A BMI of 30 and above is considered obese.  Watch levels of cholesterol and blood lipids  You should start having your blood tested for lipids and cholesterol at 54 years of age, then have this test every 5 years.  You may need to have your cholesterol levels checked more often if: ? Your lipid or cholesterol levels are high. ? You are older than 54 years of age. ? You are at high risk for heart disease.  Cancer screening Lung Cancer  Lung cancer screening is recommended for adults 52-25 years old who are at high risk for lung cancer because of a history of smoking.  A  yearly low-dose CT scan of the lungs is recommended for people who: ? Currently smoke. ? Have quit within the past 15 years. ? Have at least a 30-pack-year history of smoking. A pack year is smoking an average of one pack of cigarettes a day for 1 year.  Yearly screening should continue until it has been 15 years since you quit.  Yearly screening should stop if you develop a health problem that would prevent you from having lung cancer treatment.  Breast Cancer  Practice breast self-awareness. This means understanding how your breasts normally appear and feel.  It also means doing regular breast self-exams. Let your health care provider know about any changes, no matter how small.  If you are in your 20s or 30s, you should have a clinical breast exam (CBE) by a health care provider every 1-3 years as part of a regular health exam.  If you are 4 or older, have a CBE every year. Also consider having a breast X-ray (mammogram) every year.  If you have a family history of breast cancer, talk to your health care provider about genetic screening.  If you are at high risk for breast cancer, talk to your health  care provider about having an MRI and a mammogram every year.  Breast cancer gene (BRCA) assessment is recommended for women who have family members with BRCA-related cancers. BRCA-related cancers include: ? Breast. ? Ovarian. ? Tubal. ? Peritoneal cancers.  Results of the assessment will determine the need for genetic counseling and BRCA1 and BRCA2 testing.  Cervical Cancer Your health care provider may recommend that you be screened regularly for cancer of the pelvic organs (ovaries, uterus, and vagina). This screening involves a pelvic examination, including checking for microscopic changes to the surface of your cervix (Pap test). You may be encouraged to have this screening done every 3 years, beginning at age 12.  For women ages 13-65, health care providers may recommend pelvic exams and Pap testing every 3 years, or they may recommend the Pap and pelvic exam, combined with testing for human papilloma virus (HPV), every 5 years. Some types of HPV increase your risk of cervical cancer. Testing for HPV may also be done on women of any age with unclear Pap test results.  Other health care providers may not recommend any screening for nonpregnant women who are considered low risk for pelvic cancer and who do not have symptoms. Ask your health care provider if a screening pelvic exam is right for you.  If you have had past treatment for cervical cancer or a condition that could lead to cancer, you need Pap tests and screening for cancer for at least 20 years after your treatment. If Pap tests have been discontinued, your risk factors (such as having a new sexual partner) need to be reassessed to determine if screening should resume. Some women have medical problems that increase the chance of getting cervical cancer. In these cases, your health care provider may recommend more frequent screening and Pap tests.  Colorectal Cancer  This type of cancer can be detected and often  prevented.  Routine colorectal cancer screening usually begins at 54 years of age and continues through 54 years of age.  Your health care provider may recommend screening at an earlier age if you have risk factors for colon cancer.  Your health care provider may also recommend using home test kits to check for hidden blood in the stool.  A small camera at the end of  a tube can be used to examine your colon directly (sigmoidoscopy or colonoscopy). This is done to check for the earliest forms of colorectal cancer.  Routine screening usually begins at age 45.  Direct examination of the colon should be repeated every 5-10 years through 54 years of age. However, you may need to be screened more often if early forms of precancerous polyps or small growths are found.  Skin Cancer  Check your skin from head to toe regularly.  Tell your health care provider about any new moles or changes in moles, especially if there is a change in a mole's shape or color.  Also tell your health care provider if you have a mole that is larger than the size of a pencil eraser.  Always use sunscreen. Apply sunscreen liberally and repeatedly throughout the day.  Protect yourself by wearing long sleeves, pants, a wide-brimmed hat, and sunglasses whenever you are outside.  Heart disease, diabetes, and high blood pressure  High blood pressure causes heart disease and increases the risk of stroke. High blood pressure is more likely to develop in: ? People who have blood pressure in the high end of the normal range (130-139/85-89 mm Hg). ? People who are overweight or obese. ? People who are African American.  If you are 65-44 years of age, have your blood pressure checked every 3-5 years. If you are 35 years of age or older, have your blood pressure checked every year. You should have your blood pressure measured twice-once when you are at a hospital or clinic, and once when you are not at a hospital or clinic.  Record the average of the two measurements. To check your blood pressure when you are not at a hospital or clinic, you can use: ? An automated blood pressure machine at a pharmacy. ? A home blood pressure monitor.  If you are between 55 years and 4 years old, ask your health care provider if you should take aspirin to prevent strokes.  Have regular diabetes screenings. This involves taking a blood sample to check your fasting blood sugar level. ? If you are at a normal weight and have a low risk for diabetes, have this test once every three years after 54 years of age. ? If you are overweight and have a high risk for diabetes, consider being tested at a younger age or more often. Preventing infection Hepatitis B  If you have a higher risk for hepatitis B, you should be screened for this virus. You are considered at high risk for hepatitis B if: ? You were born in a country where hepatitis B is common. Ask your health care provider which countries are considered high risk. ? Your parents were born in a high-risk country, and you have not been immunized against hepatitis B (hepatitis B vaccine). ? You have HIV or AIDS. ? You use needles to inject street drugs. ? You live with someone who has hepatitis B. ? You have had sex with someone who has hepatitis B. ? You get hemodialysis treatment. ? You take certain medicines for conditions, including cancer, organ transplantation, and autoimmune conditions.  Hepatitis C  Blood testing is recommended for: ? Everyone born from [birth year] through 1967. ? Anyone with known risk factors for hepatitis C.  Sexually transmitted infections (STIs)  You should be screened for sexually transmitted infections (STIs) including gonorrhea and chlamydia if: ? You are sexually active and are younger than 54 years of age. ? You are older than  54 years of age and your health care provider tells you that you are at risk for this type of infection. ? Your sexual  activity has changed since you were last screened and you are at an increased risk for chlamydia or gonorrhea. Ask your health care provider if you are at risk.  If you do not have HIV, but are at risk, it may be recommended that you take a prescription medicine daily to prevent HIV infection. This is called pre-exposure prophylaxis (PrEP). You are considered at risk if: ? You are sexually active and do not regularly use condoms or know the HIV status of your partner(s). ? You take drugs by injection. ? You are sexually active with a partner who has HIV.  Talk with your health care provider about whether you are at high risk of being infected with HIV. If you choose to begin PrEP, you should first be tested for HIV. You should then be tested every 3 months for as long as you are taking PrEP. Pregnancy  If you are premenopausal and you may become pregnant, ask your health care provider about preconception counseling.  If you may become pregnant, take 400 to 800 micrograms (mcg) of folic acid every day.  If you want to prevent pregnancy, talk to your health care provider about birth control (contraception). Osteoporosis and menopause  Osteoporosis is a disease in which the bones lose minerals and strength with aging. This can result in serious bone fractures. Your risk for osteoporosis can be identified using a bone density scan.  If you are 29 years of age or older, or if you are at risk for osteoporosis and fractures, ask your health care provider if you should be screened.  Ask your health care provider whether you should take a calcium or vitamin D supplement to lower your risk for osteoporosis.  Menopause may have certain physical symptoms and risks.  Hormone replacement therapy may reduce some of these symptoms and risks. Talk to your health care provider about whether hormone replacement therapy is right for you. Follow these instructions at home:  Schedule regular health, dental,  and eye exams.  Stay current with your immunizations.  Do not use any tobacco products including cigarettes, chewing tobacco, or electronic cigarettes.  If you are pregnant, do not drink alcohol.  If you are breastfeeding, limit how much and how often you drink alcohol.  Limit alcohol intake to no more than 1 drink per day for nonpregnant women. One drink equals 12 ounces of beer, 5 ounces of wine, or 1 ounces of hard liquor.  Do not use street drugs.  Do not share needles.  Ask your health care provider for help if you need support or information about quitting drugs.  Tell your health care provider if you often feel depressed.  Tell your health care provider if you have ever been abused or do not feel safe at home. This information is not intended to replace advice given to you by your health care provider. Make sure you discuss any questions you have with your health care provider. Document Released: 10/29/2010 Document Revised: 09/21/2015 Document Reviewed: 01/17/2015 Elsevier Interactive Patient Education  [employer].

## 2017-05-21 NOTE — Progress Notes (Signed)
Cassidy Giles 29-Jan-1966 [id number]    History:    Presents for annual exam.  Postmenopausal on no HRT with no bleeding. Not sexually active., denies need for STD screen. 2017 normal Pap with positive HR HPV with -16, 18 and 45. Normal Pap 2018 with negative HR HPV. Normal mammogram history. Sr. breast cancer age 52. 2016 benign colon polyps 5 year follow-up. History of a pelvic kidney. Primary care manages hypertension, diabetes.  Past medical history, past surgical history, family history and social history were all reviewed and documented in the EPIC chart. School counselor now working in administration.  ROS:  A ROS was performed and pertinent positives and negatives are included.  Exam:  Vitals:   05/21/17 1527  BP: 132/80  Weight: 264 lb (119.7 kg)  Height: 5\' 4"  (1.626 m)   Body mass index is 45.32 kg/m.   General appearance:  Normal Thyroid:  Symmetrical, normal in size, without palpable masses or nodularity. Respiratory  Auscultation:  Clear without wheezing or rhonchi Cardiovascular  Auscultation:  Regular rate, without rubs, murmurs or gallops  Edema/varicosities:  Not grossly evident Abdominal  Soft,nontender, without masses, guarding or rebound.  Liver/spleen:  No organomegaly noted  Hernia:  None appreciated  Skin  Inspection:  Grossly normal   Breasts: Examined lying and sitting.     Right: Without masses, retractions, discharge or axillary adenopathy.     Left: Without masses, retractions, discharge or axillary adenopathy. Gentitourinary   Inguinal/mons:  Normal without inguinal adenopathy  External genitalia:  Normal  BUS/Urethra/Skene's glands:  Normal  Vagina:  Normal  Cervix:  Normal  Uterus:  Fibroid uterus, shape and contour.  Midline and mobile  Adnexa/parametria:     Rt: Without masses or tenderness.   Lt: Without masses or tenderness.  Anus and perineum: Normal  Digital rectal exam: Normal sphincter tone without palpated masses or  tenderness  Assessment/Plan:  52 y.o. SBF G0 for annual exam with complaint of vaginal discharge with occasional odor  Postmenopausal/no HRT/no bleeding Bacteria vaginosis Hypertension/diabetes/asthma-primary care manages labs and meds Obesity  Plan: Flagyl 500 twice daily for 7 days, alcohol precautions reviewed. Instructed to call if no relief of discharge. SBE's, continue annual screening mammograms, calcium rich foods, vitamin D 2000 daily encouraged. Reviewed importance of increasing exercise and decreasing carbs for weight loss, currently in a weight loss challenge at work. Pap normal 2018, new screening guidelines reviewed.  Huel Cote [facility], 5:06 PM 05/21/2017

## 2017-10-21 ENCOUNTER — Encounter: Payer: Self-pay | Admitting: Women's Health

## 2017-10-21 ENCOUNTER — Ambulatory Visit: Payer: BC Managed Care – PPO | Admitting: Women's Health

## 2017-10-21 ENCOUNTER — Telehealth: Payer: Self-pay | Admitting: *Deleted

## 2017-10-21 ENCOUNTER — Other Ambulatory Visit: Payer: Self-pay | Admitting: Women's Health

## 2017-10-21 VITALS — BP 124/80

## 2017-10-21 DIAGNOSIS — B3731 Acute candidiasis of vulva and vagina: Secondary | ICD-10-CM

## 2017-10-21 DIAGNOSIS — N760 Acute vaginitis: Secondary | ICD-10-CM

## 2017-10-21 DIAGNOSIS — N898 Other specified noninflammatory disorders of vagina: Secondary | ICD-10-CM

## 2017-10-21 DIAGNOSIS — B373 Candidiasis of vulva and vagina: Secondary | ICD-10-CM | POA: Diagnosis not present

## 2017-10-21 DIAGNOSIS — B9689 Other specified bacterial agents as the cause of diseases classified elsewhere: Secondary | ICD-10-CM | POA: Diagnosis not present

## 2017-10-21 LAB — WET PREP FOR TRICH, YEAST, CLUE

## 2017-10-21 MED ORDER — FLUCONAZOLE 150 MG PO TABS: ORAL_TABLET | ORAL | 1 refills | Status: DC

## 2017-10-21 MED ORDER — METRONIDAZOLE 500 MG PO TABS: 500.0000 mg | ORAL_TABLET | Freq: Two times a day (BID) | ORAL | 0 refills | Status: DC

## 2017-10-21 MED ORDER — METRONIDAZOLE 0.75 % VA GEL: VAGINAL | 0 refills | Status: DC

## 2017-10-21 NOTE — Telephone Encounter (Signed)
Patient was seen today and said metrogel Rx is too expensive with her insurance asked if pill form could be sent to pharmacy? Please advise

## 2017-10-21 NOTE — Progress Notes (Signed)
52 year old SPF G72 presents with complaint of vaginal irritation with dryness, itching for the past week.  Denies odor, urinary symptoms, abdominal pain or fever.  Not sexually active in months denies need for STD screen.  Postmenopausal having no spotting no HRT with no bleeding greater than 18 months.  Primary care manages hypertension and diabetes.  Exam: Appears well.  Abdomen obese, external genitalia within normal limits, speculum exam moderate amount of the milky discharge noted, wet prep positive for yeast, clues, TNTC bacteria.  Bimanual no CMT or adnexal tenderness.  Bacterial vaginosis Yeast vaginitis  Plan: Flagyl 500 twice daily for 7 days, alcohol precautions reviewed.  Diflucan 150 p.o. x1 dose with refill given, instructed to call if no relief of symptoms.  Yeast prevention discussed.  Reviewed importance of condoms if sexually active.

## 2017-10-21 NOTE — Patient Instructions (Signed)
Bacterial Vaginosis Bacterial vaginosis is an infection of the vagina. It happens when too many germs (bacteria) grow in the vagina. This infection puts you at risk for infections from sex (STIs). Treating this infection can lower your risk for some STIs. You should also treat this if you are pregnant. It can cause your baby to be born early. Follow these instructions at home: Medicines  Take over-the-counter and prescription medicines only as told by your doctor.  Take or use your antibiotic medicine as told by your doctor. Do not stop taking or using it even if you start to feel better. General instructions  If you your sexual partner is a woman, tell her that you have this infection. She needs to get treatment if she has symptoms. If you have a female partner, he does not need to be treated.  During treatment: ? Avoid sex. ? Do not douche. ? Avoid alcohol as told. ? Avoid breastfeeding as told.  Drink enough fluid to keep your pee (urine) clear or pale yellow.  Keep your vagina and butt (rectum) clean. ? Wash the area with warm water every day. ? Wipe from front to back after you use the toilet.  Keep all follow-up visits as told by your doctor. This is important. Preventing this condition  Do not douche.  Use only warm water to wash around your vagina.  Use protection when you have sex. This includes: ? Latex condoms. ? Dental dams.  Limit how many people you have sex with. It is best to only have sex with the same person (be monogamous).  Get tested for STIs. Have your partner get tested.  Wear underwear that is cotton or lined with cotton.  Avoid tight pants and pantyhose. This is most important in summer.  Do not use any products that have nicotine or tobacco in them. These include cigarettes and e-cigarettes. If you need help quitting, ask your doctor.  Do not use illegal drugs.  Limit how much alcohol you drink. Contact a doctor if:  Your symptoms do not get  better, even after you are treated.  You have more discharge or pain when you pee (urinate).  You have a fever.  You have pain in your belly (abdomen).  You have pain with sex.  Your bleed from your vagina between periods. Summary  This infection happens when too many germs (bacteria) grow in the vagina.  Treating this condition can lower your risk for some infections from sex (STIs).  You should also treat this if you are pregnant. It can cause early (premature) birth.  Do not stop taking or using your antibiotic medicine even if you start to feel better. This information is not intended to replace advice given to you by your health care provider. Make sure you discuss any questions you have with your health care provider. Document Released: 01/23/2008 Document Revised: 12/30/2015 Document Reviewed: 12/30/2015 Elsevier Interactive Patient Education  2017 [employer] Vaginal Yeast infection, Adult Vaginal yeast infection is a condition that causes soreness, swelling, and redness (inflammation) of the vagina. It also causes vaginal discharge. This is a common condition. Some women get this infection frequently. What are the causes? This condition is caused by a change in the normal balance of the yeast (candida) and bacteria that live in the vagina. This change causes an overgrowth of yeast, which causes the inflammation. What increases the risk? This condition is more likely to develop in:  Women who take antibiotic medicines.  Women who have  diabetes.  Women who take birth control pills.  Women who are pregnant.  Women who douche often.  Women who have a weak defense (immune) system.  Women who have been taking steroid medicines for a long time.  Women who frequently wear tight clothing.  What are the signs or symptoms? Symptoms of this condition include:  White, thick vaginal discharge.  Swelling, itching, redness, and irritation of the vagina. The lips of the  vagina (vulva) may be affected as well.  Pain or a burning feeling while urinating.  Pain during sex.  How is this diagnosed? This condition is diagnosed with a medical history and physical exam. This will include a pelvic exam. Your health care provider will examine a sample of your vaginal discharge under a microscope. Your health care provider may send this sample for testing to confirm the diagnosis. How is this treated? This condition is treated with medicine. Medicines may be over-the-counter or prescription. You may be told to use one or more of the following:  Medicine that is taken orally.  Medicine that is applied as a cream.  Medicine that is inserted directly into the vagina (suppository).  Follow these instructions at home:  Take or apply over-the-counter and prescription medicines only as told by your health care provider.  Do not have sex until your health care provider has approved. Tell your sex partner that you have a yeast infection. That person should go to his or her health care provider if he or she develops symptoms.  Do not wear tight clothes, such as pantyhose or tight pants.  Avoid using tampons until your health care provider approves.  Eat more yogurt. This may help to keep your yeast infection from returning.  Try taking a sitz bath to help with discomfort. This is a warm water bath that is taken while you are sitting down. The water should only come up to your hips and should cover your buttocks. Do this 3-4 times per day or as told by your health care provider.  Do not douche.  Wear breathable, cotton underwear.  If you have diabetes, keep your blood sugar levels under control. Contact a health care provider if:  You have a fever.  Your symptoms go away and then return.  Your symptoms do not get better with treatment.  Your symptoms get worse.  You have new symptoms.  You develop blisters in or around your vagina.  You have blood coming  from your vagina and it is not your menstrual period.  You develop pain in your abdomen. This information is not intended to replace advice given to you by your health care provider. Make sure you discuss any questions you have with your health care provider. Document Released: 01/23/2005 Document Revised: 09/27/2015 Document Reviewed: 10/17/2014 Elsevier Interactive Patient Education  2018 [employer].

## 2017-10-21 NOTE — Telephone Encounter (Signed)
Telephone call, sent in Rx for Flagyl tablets 500 twice daily for 7 days

## 2018-04-30 ENCOUNTER — Encounter: Payer: Self-pay | Admitting: Women's Health

## 2018-05-12 ENCOUNTER — Encounter (INDEPENDENT_AMBULATORY_CARE_PROVIDER_SITE_OTHER): Payer: BC Managed Care – PPO

## 2018-05-18 ENCOUNTER — Encounter: Payer: BC Managed Care – PPO | Admitting: Women's Health

## 2018-05-21 ENCOUNTER — Encounter (INDEPENDENT_AMBULATORY_CARE_PROVIDER_SITE_OTHER): Payer: Self-pay | Admitting: Bariatrics

## 2018-05-21 ENCOUNTER — Ambulatory Visit (INDEPENDENT_AMBULATORY_CARE_PROVIDER_SITE_OTHER): Payer: BC Managed Care – PPO | Admitting: Bariatrics

## 2018-05-21 VITALS — BP 185/89 | HR 69 | Temp 97.8°F | Ht 64.0 in | Wt 264.0 lb

## 2018-05-21 DIAGNOSIS — Z1331 Encounter for screening for depression: Secondary | ICD-10-CM

## 2018-05-21 DIAGNOSIS — E7849 Other hyperlipidemia: Secondary | ICD-10-CM

## 2018-05-21 DIAGNOSIS — R5383 Other fatigue: Secondary | ICD-10-CM | POA: Diagnosis not present

## 2018-05-21 DIAGNOSIS — Z9189 Other specified personal risk factors, not elsewhere classified: Secondary | ICD-10-CM

## 2018-05-21 DIAGNOSIS — Z6841 Body Mass Index (BMI) 40.0 and over, adult: Secondary | ICD-10-CM

## 2018-05-21 DIAGNOSIS — I10 Essential (primary) hypertension: Secondary | ICD-10-CM

## 2018-05-21 DIAGNOSIS — E119 Type 2 diabetes mellitus without complications: Secondary | ICD-10-CM

## 2018-05-21 DIAGNOSIS — Z0289 Encounter for other administrative examinations: Secondary | ICD-10-CM

## 2018-05-21 DIAGNOSIS — R0602 Shortness of breath: Secondary | ICD-10-CM | POA: Diagnosis not present

## 2018-05-21 DIAGNOSIS — F3289 Other specified depressive episodes: Secondary | ICD-10-CM

## 2018-05-21 MED ORDER — VALSARTAN-HYDROCHLOROTHIAZIDE 160-12.5 MG PO TABS: 1.0000 | ORAL_TABLET | Freq: Every day | ORAL | 0 refills | Status: DC

## 2018-05-22 LAB — CBC WITH DIFFERENTIAL
Basophils Absolute: 0 10*3/uL (ref 0.0–0.2)
Basos: 1 %
EOS (ABSOLUTE): 0 10*3/uL (ref 0.0–0.4)
Eos: 1 %
HEMOGLOBIN: 12.8 g/dL (ref 11.1–15.9)
Hematocrit: 38.6 % (ref 34.0–46.6)
Immature Grans (Abs): 0 10*3/uL (ref 0.0–0.1)
Immature Granulocytes: 0 %
LYMPHS ABS: 2.5 10*3/uL (ref 0.7–3.1)
Lymphs: 44 %
MCH: 26.4 pg — AB (ref 26.6–33.0)
MCHC: 33.2 g/dL (ref 31.5–35.7)
MCV: 80 fL (ref 79–97)
MONOS ABS: 0.4 10*3/uL (ref 0.1–0.9)
Monocytes: 6 %
NEUTROS ABS: 2.8 10*3/uL (ref 1.4–7.0)
NEUTROS PCT: 48 %
RBC: 4.85 x10E6/uL (ref 3.77–5.28)
RDW: 13.4 % (ref 11.7–15.4)
WBC: 5.8 10*3/uL (ref 3.4–10.8)

## 2018-05-22 LAB — T4, FREE: Free T4: 1.44 ng/dL (ref 0.82–1.77)

## 2018-05-22 LAB — LIPID PANEL WITH LDL/HDL RATIO
Cholesterol, Total: 246 mg/dL — ABNORMAL HIGH (ref 100–199)
HDL: 83 mg/dL (ref >39)
LDL Calculated: 145 mg/dL — ABNORMAL HIGH (ref 0–99)
LDl/HDL Ratio: 1.7 ratio (ref 0.0–3.2)
Triglycerides: 88 mg/dL (ref 0–149)
VLDL Cholesterol Cal: 18 mg/dL (ref 5–40)

## 2018-05-22 LAB — INSULIN, RANDOM: INSULIN: 11.5 u[IU]/mL (ref 2.6–24.9)

## 2018-05-22 LAB — COMPREHENSIVE METABOLIC PANEL
ALT: 20 IU/L (ref 0–32)
AST: 15 IU/L (ref 0–40)
Albumin/Globulin Ratio: 1.4 (ref 1.2–2.2)
Albumin: 4.6 g/dL (ref 3.8–4.9)
Alkaline Phosphatase: 61 IU/L (ref 39–117)
BUN/Creatinine Ratio: 11 (ref 9–23)
BUN: 9 mg/dL (ref 6–24)
Bilirubin Total: 0.9 mg/dL (ref 0.0–1.2)
CO2: 23 mmol/L (ref 20–29)
Calcium: 9.9 mg/dL (ref 8.7–10.2)
Chloride: 98 mmol/L (ref 96–106)
Creatinine, Ser: 0.8 mg/dL (ref 0.57–1.00)
GFR calc Af Amer: 97 mL/min/{1.73_m2} (ref >59)
GFR calc non Af Amer: 84 mL/min/{1.73_m2} (ref >59)
Globulin, Total: 3.2 g/dL (ref 1.5–4.5)
Glucose: 141 mg/dL — ABNORMAL HIGH (ref 65–99)
Potassium: 4.5 mmol/L (ref 3.5–5.2)
Sodium: 140 mmol/L (ref 134–144)
Total Protein: 7.8 g/dL (ref 6.0–8.5)

## 2018-05-22 LAB — HEMOGLOBIN A1C
Est. average glucose Bld gHb Est-mCnc: 163 mg/dL
Hgb A1c MFr Bld: 7.3 % — ABNORMAL HIGH (ref 4.8–5.6)

## 2018-05-22 LAB — MICROALBUMIN / CREATININE URINE RATIO
Creatinine, Urine: 10.3 mg/dL
Microalb/Creat Ratio: <29 mg/g creat (ref 0–29)
Microalbumin, Urine: <3 ug/mL

## 2018-05-22 LAB — VITAMIN B12: Vitamin B-12: 434 pg/mL (ref 232–1245)

## 2018-05-22 LAB — VITAMIN D 25 HYDROXY (VIT D DEFICIENCY, FRACTURES): Vit D, 25-Hydroxy: 12.8 ng/mL — ABNORMAL LOW (ref 30.0–100.0)

## 2018-05-22 LAB — T3: T3, Total: 115 ng/dL (ref 71–180)

## 2018-05-22 LAB — TSH: TSH: 1.55 u[IU]/mL (ref 0.450–4.500)

## 2018-05-22 LAB — FOLATE: Folate: 10.8 ng/mL (ref >3.0)

## 2018-05-25 NOTE — Progress Notes (Signed)
.  Office: [phone]  /  Fax: [phone]   HPI:   Chief Complaint: OBESITY  Cassidy Giles (MR# [id number]) is a 53 y.o. female who presents on 05/21/2018 for obesity evaluation and treatment. Current BMI is Body mass index is 45.32 kg/m.Cassidy Giles has struggled with obesity for years and has been unsuccessful in either losing weight or maintaining long term weight loss. Cassidy Giles attended our information session and states she is currently in the action stage of change and ready to dedicate time achieving and maintaining a healthier weight.  Cassidy Giles states her family eats meals together her desired weight loss is 104 lbs she started gaining weight in 2000 her heaviest weight ever was 264 lbs. she states that she "eats the wrong things" she does not do meal planning she snacks on salty, savory foods she sometimes skips breakfast and lunch she is frequently drinking liquids with calories she sometimes makes poor food choices she sometimes eats larger portions than normal  she has binge eating behaviors she struggles with emotional eating    Fatigue Cassidy Giles feels her energy is lower than it should be. This has worsened with weight gain and has not worsened recently. Cassidy Giles admits to daytime somnolence and she admits to waking up still tired. Patient is at risk for obstructive sleep apnea. Patent has a history of symptoms of daytime fatigue, morning fatigue, morning headache and hypertension. She has more fatigue with activities. Patient generally gets 6 hours of sleep per night, and states they generally have restless sleep. Snoring is present. Apneic episodes are not present. Epworth Sleepiness Score is 8  Dyspnea on exertion Cassidy Giles notes increasing shortness of breath with certain activities and seems to be worsening over time with weight gain. She notes getting out of breath sooner with activity than she used to. This has not gotten worse recently.Azari has right knee pain and she denies  orthopnea.  Hypertension Cassidy Giles is a 53 y.o. female with hypertension. She is not taking any medications. Cassidy Giles admits headaches. She is working weight loss to help control her blood pressure with the goal of decreasing her risk of heart attack and stroke. Cassidy Giles blood pressure is currently controlled.  Diabetes II Cassidy Giles has a diagnosis of diabetes type II. Cassidy Giles is not taking Metformin. She has appropriate hunger. She is attempting to work on intensive lifestyle modifications including diet, exercise, and weight loss to help control her blood glucose levels.  Hyperlipidemia Cassidy Giles has hyperlipidemia and she was taking Welchol. Cassidy Giles is attempting to improve her cholesterol levels with intensive lifestyle modification including a low saturated fat diet, exercise and weight loss. She denies any chest pain, claudication or myalgias.  At risk for cardiovascular disease Cassidy Giles is at a higher than average risk for cardiovascular disease due to obesity, hypertension and diabetes. She currently denies any chest pain.  Depression with emotional eating behaviors Cassidy Giles is struggling with emotional eating and using food for comfort to the extent that it is negatively impacting her health. She often snacks when she is not hungry. Cassidy Giles sometimes feels she is out of control and then feels guilty that she made poor food choices. She is attempting to work on behavior modification techniques to help reduce her emotional eating. She shows no sign of suicidal or homicidal ideations.  Depression Screen Cassidy Giles's Food and Mood (modified PHQ-9) score was  Depression screen PHQ 2/9 05/21/2018  Decreased Interest 3  Down, Depressed, Hopeless 0  PHQ - 2 Score 3  Altered sleeping 2  Tired, decreased energy 3  Change in appetite 2  Feeling bad or failure about yourself  0  Trouble concentrating 2  Moving slowly or fidgety/restless 3  Suicidal thoughts 0  PHQ-9 Score 15  Difficult doing  work/chores Not difficult at all    ASSESSMENT AND PLAN:  Other fatigue - Plan: EKG 12-Lead, CBC With Differential, VITAMIN D 25 Hydroxy (Vit-D Deficiency, Fractures), Vitamin B12, Folate, T3, T4, free, TSH  SOB (shortness of breath) on exertion - Plan: CBC With Differential, VITAMIN D 25 Hydroxy (Vit-D Deficiency, Fractures), Vitamin B12, Folate, T3, T4, free, TSH  Essential hypertension - Plan: Comprehensive metabolic panel, CBC With Differential, valsartan-hydrochlorothiazide (DIOVAN HCT) 160-12.5 MG tablet  Type 2 diabetes mellitus without complication, without long-term current use of insulin (HCC) - Plan: Comprehensive metabolic panel, Insulin, random, Hemoglobin A1c, Microalbumin / creatinine urine ratio  Other hyperlipidemia - Plan: Lipid Panel With LDL/HDL Ratio  Other depression  Depression screening  At risk for heart disease  Class 3 severe obesity with serious comorbidity and body mass index (BMI) of 45.0 to 49.9 in adult, unspecified obesity type (HCC)  PLAN:  Fatigue Cassidy Giles was informed that her fatigue may be related to obesity, depression or many other causes. Labs will be ordered, and in the meanwhile Skylin has agreed to work on diet, exercise and weight loss to help with fatigue. Proper sleep hygiene was discussed including the need for 7-8 hours of quality sleep each night. A sleep study was not ordered based on symptoms and Epworth score.  Dyspnea on exertion Cassidy Giles's shortness of breath appears to be obesity related and exercise induced. She has agreed to work on weight loss and gradually increase exercise to treat her exercise induced shortness of breath. If Cassidy Giles follows our instructions and loses weight without improvement of her shortness of breath, we will plan to refer to pulmonology. We will monitor this condition regularly. Cassidy Giles agrees to this plan.  Hypertension We discussed sodium restriction, working on healthy weight loss, and a regular exercise  program as the means to achieve improved blood pressure control. Cassidy Giles agreed with this plan and agreed to follow up as directed. We will continue to monitor her blood pressure as well as her progress with the above lifestyle modifications. She agreed to start Valsartan-HCTZ 160-12.5 mg once daily #30 with no refills and will watch for signs of hypotension as she continues her lifestyle modifications.  Diabetes II Cassidy Giles has been given extensive diabetes education by myself today including ideal fasting and post-prandial blood glucose readings, individual ideal Hgb A1c goals and hypoglycemia prevention. We discussed the importance of good blood sugar control to decrease the likelihood of diabetic complications such as nephropathy, neuropathy, limb loss, blindness, coronary artery disease, and death. We discussed the importance of intensive lifestyle modification including diet, exercise and weight loss as the first line treatment for diabetes. We will check Hgb A1c and insulin level today and she will follow up at the agreed upon time.  Hyperlipidemia Cassidy Giles was informed of the American Heart Association Guidelines emphasizing intensive lifestyle modifications as the first line treatment for hyperlipidemia. We discussed many lifestyle modifications today in depth, and Leiloni will start to work on decreasing saturated fats such as fatty red meat, butter and many fried foods. She will also increase vegetables and lean protein in her diet and start to work on exercise and weight loss efforts. We will check lipids today and Cassidy Giles will follow up as directed.  Cardiovascular risk counseling Cassidy Giles was given  extended (15 minutes) coronary artery disease prevention counseling today. She is 53 y.o. female and has risk factors for heart disease including obesity, hypertension and diabetes. We discussed intensive lifestyle modifications today with an emphasis on specific weight loss instructions and strategies. Pt  was also informed of the importance of increasing exercise and decreasing saturated fats to help prevent heart disease.  Depression with Emotional Eating Behaviors We discussed behavior modification techniques today to help Cassidy Giles deal with her emotional eating and depression. We will refer to Dr. Mallie Mussel our bariatric psychologist.  Depression Screen Lorre had a strongly positive depression screening. Depression is commonly associated with obesity and often results in emotional eating behaviors. We will monitor this closely and work on CBT to help improve the non-hunger eating patterns.   Obesity Gertie is currently in the action stage of change and her goal is to continue with weight loss efforts She has agreed to follow the Category 2 plan Delayne has been instructed to work up to a goal of 150 minutes of combined cardio and strengthening exercise per week for weight loss and overall health benefits. We discussed the following Behavioral Modification Strategies today: increase H2O intake, keeping healthy foods in the home, increasing lean protein intake, decreasing simple carbohydrates, increasing vegetables and work on meal planning and more regular meals  Bettejane has agreed to follow up with our clinic in 2 weeks. She was informed of the importance of frequent follow up visits to maximize her success with intensive lifestyle modifications for her multiple health conditions. She was informed we would discuss her lab results at her next visit unless there is a critical issue that needs to be addressed sooner. Lilybelle agreed to keep her next visit at the agreed upon time to discuss these results.  ALLERGIES: Allergies  Allergen Reactions  . Other     Seasonal allergies    MEDICATIONS: Current Outpatient Medications on File Prior to Visit  Medication Sig Dispense Refill  . zolpidem (AMBIEN) 5 MG tablet Take 5 mg by mouth at bedtime as needed.      . fluconazole (DIFLUCAN) 150 MG tablet Take  one dose today and repeat in 1 week (Patient not taking: Reported on 05/21/2018) 2 tablet 1  . magnesium citrate SOLN As directed    . metFORMIN (GLUCOPHAGE-XR) 500 MG 24 hr tablet Take 1 tablet by mouth daily.    . metroNIDAZOLE (FLAGYL) 500 MG tablet Take 1 tablet (500 mg total) by mouth 2 (two) times daily. (Patient not taking: Reported on 05/21/2018) 14 tablet 0  . metroNIDAZOLE (METROGEL VAGINAL) 0.75 % vaginal gel 1 applicator per vagina at HS x 5 (Patient not taking: Reported on 05/21/2018) 70 g 0  . montelukast (SINGULAIR) 10 MG tablet Take 10 mg by mouth at bedtime.      . Multiple Vitamin (MULTIVITAMIN) tablet Take 1 tablet by mouth daily.       No current facility-administered medications on file prior to visit.     PAST MEDICAL HISTORY: Past Medical History:  Diagnosis Date  . Anemia   . Diabetes ([place])   . Fibroid   . Gallbladder problem   . High blood pressure   . High cholesterol   . Obesity     PAST SURGICAL HISTORY: Past Surgical History:  Procedure Laterality Date  . CHOLECYSTECTOMY    . HAND SURGERY     RIGHT  . INTRAUTERINE DEVICE INSERTION  01/06/2006   MI[facility]NA  . KNEE SURGERY     RIGHT  .  KNEE SURGERY  3/12017    SOCIAL HISTORY: Social History   Tobacco Use  . Smoking status: Never Smoker  . Smokeless tobacco: Never Used  Substance Use Topics  . Alcohol use: Yes    Comment: OCC  . Drug use: No    FAMILY HISTORY: Family History  Problem Relation Age of Onset  . Diabetes Mother   . Hypertension Mother   . Cancer Mother        UTERINE  . Heart disease Mother   . High Cholesterol Mother   . Sudden death Mother   . Stroke Mother   . Depression Mother   . Diabetes Sister   . Hypertension Sister   . Breast cancer Sister   . Diabetes Brother   . Cancer Brother        LUNG/LIVER...DECEASED 08/09/09  . Hypertension Brother     ROS: Review of Systems  Constitutional: Positive for malaise/fatigue.  Eyes:       + Floaters + Wear Glasses or  Contacts  Respiratory: Positive for shortness of breath (on exertion).   Cardiovascular: Negative for chest pain, orthopnea and claudication.  Musculoskeletal: Negative for myalgias.       Positive for knee pain  Skin:       + Dryness  Neurological: Positive for headaches.  Psychiatric/Behavioral: Positive for depression. Negative for suicidal ideas. The patient has insomnia.     PHYSICAL EXAM: Pulse 69, temperature 97.8 F (36.6 C), temperature source Oral, height 5\' 4"  (1.626 m), weight 264 lb (119.7 kg), last menstrual period 03/21/2017, SpO2 100 %. Body mass index is 45.32 kg/m. Physical Exam Vitals signs reviewed.  Constitutional:      Appearance: Normal appearance. She is well-developed. She is obese.  HENT:     Head: Normocephalic and atraumatic.     Nose: Nose normal.     Mouth/Throat:     Comments: Mallampati = 4 Eyes:     General: No scleral icterus.    Extraocular Movements: Extraocular movements intact.  Neck:     Musculoskeletal: Normal range of motion and neck supple.     Thyroid: No thyromegaly.  Cardiovascular:     Rate and Rhythm: Normal rate and regular rhythm.  Pulmonary:     Effort: Pulmonary effort is normal. No respiratory distress.  Abdominal:     Palpations: Abdomen is soft.     Tenderness: There is no abdominal tenderness.  Musculoskeletal: Normal range of motion.     Comments: Range of Motion normal in all 4 extremities  Skin:    General: Skin is warm and dry.  Neurological:     Mental Status: She is alert and oriented to person, place, and time.     Coordination: Coordination normal.  Psychiatric:        Mood and Affect: Mood normal.        Behavior: Behavior normal.        Thought Content: Thought content does not include homicidal or suicidal ideation.     RECENT LABS AND TESTS: BMET    Component Value Date/Time   NA 140 05/21/2018 0938   K 4.5 05/21/2018 0938   CL 98 05/21/2018 0938   CO2 23 05/21/2018 0938   GLUCOSE 141 (H)  05/21/2018 0938   GLUCOSE 144 (H) 09/06/2015 1004   BUN 9 05/21/2018 0938   CREATININE 0.80 05/21/2018 0938   CALCIUM 9.9 05/21/2018 0938   GFRNONAA 84 05/21/2018 0938   GFRAA 97 05/21/2018 0938   Lab Results  Component Value Date   HGBA1C 7.3 (H) 05/21/2018   Lab Results  Component Value Date   INSULIN 11.5 05/21/2018   CBC    Component Value Date/Time   WBC 5.8 05/21/2018 0938   WBC 7.5 09/06/2015 1004   RBC 4.85 05/21/2018 0938   RBC 4.84 09/06/2015 1004   HGB 12.8 05/21/2018 0938   HCT 38.6 05/21/2018 0938   PLT 260 09/06/2015 1004   MCV 80 05/21/2018 0938   MCH 26.4 (L) 05/21/2018 0938   MCH 26.4 09/06/2015 1004   MCHC 33.2 05/21/2018 0938   MCHC 32.5 09/06/2015 1004   RDW 13.4 05/21/2018 0938   LYMPHSABS 2.5 05/21/2018 0938   MONOABS 0.4 09/06/2015 1004   EOSABS 0.0 05/21/2018 0938   BASOSABS 0.0 05/21/2018 0938   Iron/TIBC/Ferritin/ %Sat No results found for: IRON, TIBC, FERRITIN, IRONPCTSAT Lipid Panel     Component Value Date/Time   CHOL 246 (H) 05/21/2018 0938   TRIG 88 05/21/2018 0938   HDL 83 05/21/2018 0938   LDLCALC 145 (H) 05/21/2018 0938   Hepatic Function Panel     Component Value Date/Time   PROT 7.8 05/21/2018 0938   ALBUMIN 4.6 05/21/2018 0938   AST 15 05/21/2018 0938   ALT 20 05/21/2018 0938   ALKPHOS 61 05/21/2018 0938   BILITOT 0.9 05/21/2018 0938      Component Value Date/Time   TSH 1.550 05/21/2018 0938   Vitamin D There are no recent lab results  ECG  shows NSR with a rate of 70 BPM INDIRECT CALORIMETER done today shows a VO2 of 222 and a REE of 1545. Her calculated basal metabolic rate is 4268 thus her basal metabolic rate is worse than expected.       OBESITY BEHAVIORAL INTERVENTION VISIT  Today's visit was # 1   Starting weight: 264 lbs Starting date: 05/21/2018 Today's weight : 264 lbs Today's date: 05/21/2018 Total lbs lost to date: 0   ASK: We discussed the diagnosis of obesity with Harlene Ramus  today and Aeryn agreed to give [place] permission to discuss obesity behavioral modification therapy today.  ASSESS: Keyira has the diagnosis of obesity and her BMI today is 45.29 Syan is in the action stage of change   ADVISE: Kailan was educated on the multiple health risks of obesity as well as the benefit of weight loss to improve her health. She was advised of the need for long term treatment and the importance of lifestyle modifications to improve her current health and to decrease her risk of future health problems.  AGREE: Multiple dietary modification options and treatment options were discussed and  Adream agreed to follow the recommendations documented in the above note.  ARRANGE: Tiyanna was educated on the importance of frequent visits to treat obesity as outlined per CMS and USPSTF guidelines and agreed to schedule her next follow up appointment today.   Corey Skains, am acting as Location manager for [employer]. Owens Shark, DO  I have reviewed the above documentation for accuracy and completeness, and I agree with the above. -Jearld Lesch, DO

## 2018-05-26 ENCOUNTER — Encounter: Payer: Self-pay | Admitting: Women's Health

## 2018-05-26 ENCOUNTER — Ambulatory Visit: Payer: BC Managed Care – PPO | Admitting: Women's Health

## 2018-05-26 ENCOUNTER — Encounter (INDEPENDENT_AMBULATORY_CARE_PROVIDER_SITE_OTHER): Payer: Self-pay | Admitting: Bariatrics

## 2018-05-26 VITALS — BP 140/82 | Ht 64.0 in | Wt 267.0 lb

## 2018-05-26 DIAGNOSIS — Z01419 Encounter for gynecological examination (general) (routine) without abnormal findings: Secondary | ICD-10-CM

## 2018-05-26 DIAGNOSIS — E7849 Other hyperlipidemia: Secondary | ICD-10-CM | POA: Insufficient documentation

## 2018-05-26 NOTE — Progress Notes (Signed)
Cassidy Giles 08/18/1965 [id number]    History:    Presents for annual exam.  Menopausal on no HRT with no bleeding.  Not sexually active in years, denies need for STD screen.  Normal Pap and mammogram history.  2016 benign colon polyp 5-year follow-up.  Primary care manages hypertension and diabetes.  Currently in weight loss program at [facility].  Past medical history, past surgical history, family history and social history were all reviewed and documented in the EPIC chart.  Works in administration at a school.  Sister breast cancer at age 64 doing well, BRCA status unknown but her daughter BRCA negative.Marland Kitchen  History of a pelvic kidney.  ROS:  A ROS was performed and pertinent positives and negatives are included.  Exam:  Vitals:   05/26/18 1445  BP: 140/82  Weight: 267 lb (121.1 kg)  Height: '5\' 4"'  (1.626 m)   Body mass index is 45.83 kg/m.   General appearance:  Normal Thyroid:  Symmetrical, normal in size, without palpable masses or nodularity. Respiratory  Auscultation:  Clear without wheezing or rhonchi Cardiovascular  Auscultation:  Regular rate, without rubs, murmurs or gallops  Edema/varicosities:  Not grossly evident Abdominal  Soft,nontender, without masses, guarding or rebound.  Liver/spleen:  No organomegaly noted  Hernia:  None appreciated  Skin  Inspection:  Grossly normal   Breasts: Examined lying and sitting.     Right: Without masses, retractions, discharge or axillary adenopathy.     Left: Without masses, retractions, discharge or axillary adenopathy. Gentitourinary   Inguinal/mons:  Normal without inguinal adenopathy  External genitalia:  Normal  BUS/Urethra/Skene's glands:  Normal  Vagina:  Normal  Cervix:  Normal  Uterus:   normal in size, shape and contour.  Midline and mobile  Adnexa/parametria:     Rt: Without masses or tenderness.   Lt: Without masses or tenderness.  Anus and perineum: Normal  Digital rectal exam: Normal sphincter tone  without palpated masses or tenderness  Assessment/Plan:  53 y.o. SBF G0 for annual exam with no complaints.  Postmenopausal/no HRT/no bleeding/not sexually active Hypertension, diabetes-primary care just labs and meds Obesity-currently in weight loss program at [facility].  Plan: Congratulations given on participating in weight loss program, materials reviewed, making lifestyle changes.  SBEs, continue annual screening mammogram, calcium rich foods, vitamin D 2000 daily encouraged.  Will check DEXA next year.  Encouraged to increase regular exercise and continue to decrease calorie/carbs.  Pap normal 2018, new screening guidelines reviewed.   [place], 5:13 PM 05/26/2018

## 2018-05-26 NOTE — Patient Instructions (Signed)
Health Maintenance for Postmenopausal Women Menopause is a normal process in which your reproductive ability comes to an end. This process happens gradually over a span of months to years, usually between the ages of 62 and 89. Menopause is complete when you have missed 12 consecutive menstrual periods. It is important to talk with your health care provider about some of the most common conditions that affect postmenopausal women, such as heart disease, cancer, and bone loss (osteoporosis). Adopting a healthy lifestyle and getting preventive care can help to promote your health and wellness. Those actions can also lower your chances of developing some of these common conditions. What should I know about menopause? During menopause, you may experience a number of symptoms, such as:  Moderate-to-severe hot flashes.  Night sweats.  Decrease in sex drive.  Mood swings.  Headaches.  Tiredness.  Irritability.  Memory problems.  Insomnia. Choosing to treat or not to treat menopausal changes is an individual decision that you make with your health care provider. What should I know about hormone replacement therapy and supplements? Hormone therapy products are effective for treating symptoms that are associated with menopause, such as hot flashes and night sweats. Hormone replacement carries certain risks, especially as you become older. If you are thinking about using estrogen or estrogen with progestin treatments, discuss the benefits and risks with your health care provider. What should I know about heart disease and stroke? Heart disease, heart attack, and stroke become more likely as you age. This may be due, in part, to the hormonal changes that your body experiences during menopause. These can affect how your body processes dietary fats, triglycerides, and cholesterol. Heart attack and stroke are both medical emergencies. There are many things that you can do to help prevent heart disease  and stroke:  Have your blood pressure checked at least every 1-2 years. High blood pressure causes heart disease and increases the risk of stroke.  If you are 79-72 years old, ask your health care provider if you should take aspirin to prevent a heart attack or a stroke.  Do not use any tobacco products, including cigarettes, chewing tobacco, or electronic cigarettes. If you need help quitting, ask your health care provider.  It is important to eat a healthy diet and maintain a healthy weight. ? Be sure to include plenty of vegetables, fruits, low-fat dairy products, and lean protein. ? Avoid eating foods that are high in solid fats, added sugars, or salt (sodium).  Get regular exercise. This is one of the most important things that you can do for your health. ? Try to exercise for at least 150 minutes each week. The type of exercise that you do should increase your heart rate and make you sweat. This is known as moderate-intensity exercise. ? Try to do strengthening exercises at least twice each week. Do these in addition to the moderate-intensity exercise.  Know your numbers.Ask your health care provider to check your cholesterol and your blood glucose. Continue to have your blood tested as directed by your health care provider.  What should I know about cancer screening? There are several types of cancer. Take the following steps to reduce your risk and to catch any cancer development as early as possible. Breast Cancer  Practice breast self-awareness. ? This means understanding how your breasts normally appear and feel. ? It also means doing regular breast self-exams. Let your health care provider know about any changes, no matter how small.  If you are 40 or  older, have a clinician do a breast exam (clinical breast exam or CBE) every year. Depending on your age, family history, and medical history, it may be recommended that you also have a yearly breast X-ray (mammogram).  If you  have a family history of breast cancer, talk with your health care provider about genetic screening.  If you are at high risk for breast cancer, talk with your health care provider about having an MRI and a mammogram every year.  Breast cancer (BRCA) gene test is recommended for women who have family members with BRCA-related cancers. Results of the assessment will determine the need for genetic counseling and BRCA1 and for BRCA2 testing. BRCA-related cancers include these types: ? Breast. This occurs in males or females. ? Ovarian. ? Tubal. This may also be called fallopian tube cancer. ? Cancer of the abdominal or pelvic lining (peritoneal cancer). ? Prostate. ? Pancreatic. Cervical, Uterine, and Ovarian Cancer Your health care provider may recommend that you be screened regularly for cancer of the pelvic organs. These include your ovaries, uterus, and vagina. This screening involves a pelvic exam, which includes checking for microscopic changes to the surface of your cervix (Pap test).  For women ages 21-65, health care providers may recommend a pelvic exam and a Pap test every three years. For women ages 39-65, they may recommend the Pap test and pelvic exam, combined with testing for human papilloma virus (HPV), every five years. Some types of HPV increase your risk of cervical cancer. Testing for HPV may also be done on women of any age who have unclear Pap test results.  Other health care providers may not recommend any screening for nonpregnant women who are considered low risk for pelvic cancer and have no symptoms. Ask your health care provider if a screening pelvic exam is right for you.  If you have had past treatment for cervical cancer or a condition that could lead to cancer, you need Pap tests and screening for cancer for at least 20 years after your treatment. If Pap tests have been discontinued for you, your risk factors (such as having a new sexual partner) need to be reassessed  to determine if you should start having screenings again. Some women have medical problems that increase the chance of getting cervical cancer. In these cases, your health care provider may recommend that you have screening and Pap tests more often.  If you have a family history of uterine cancer or ovarian cancer, talk with your health care provider about genetic screening.  If you have vaginal bleeding after reaching menopause, tell your health care provider.  There are currently no reliable tests available to screen for ovarian cancer. Lung Cancer Lung cancer screening is recommended for adults 57-50 years old who are at high risk for lung cancer because of a history of smoking. A yearly low-dose CT scan of the lungs is recommended if you:  Currently smoke.  Have a history of at least 30 pack-years of smoking and you currently smoke or have quit within the past 15 years. A pack-year is smoking an average of one pack of cigarettes per day for one year. Yearly screening should:  Continue until it has been 15 years since you quit.  Stop if you develop a health problem that would prevent you from having lung cancer treatment. Colorectal Cancer  This type of cancer can be detected and can often be prevented.  Routine colorectal cancer screening usually begins at age 12 and continues through  age 75.  If you have risk factors for colon cancer, your health care provider may recommend that you be screened at an earlier age.  If you have a family history of colorectal cancer, talk with your health care provider about genetic screening.  Your health care provider may also recommend using home test kits to check for hidden blood in your stool.  A small camera at the end of a tube can be used to examine your colon directly (sigmoidoscopy or colonoscopy). This is done to check for the earliest forms of colorectal cancer.  Direct examination of the colon should be repeated every 5-10 years until  age 75. However, if early forms of precancerous polyps or small growths are found or if you have a family history or genetic risk for colorectal cancer, you may need to be screened more often. Skin Cancer  Check your skin from head to toe regularly.  Monitor any moles. Be sure to tell your health care provider: ? About any new moles or changes in moles, especially if there is a change in a mole's shape or color. ? If you have a mole that is larger than the size of a pencil eraser.  If any of your family members has a history of skin cancer, especially at a young age, talk with your health care provider about genetic screening.  Always use sunscreen. Apply sunscreen liberally and repeatedly throughout the day.  Whenever you are outside, protect yourself by wearing long sleeves, pants, a wide-brimmed hat, and sunglasses. What should I know about osteoporosis? Osteoporosis is a condition in which bone destruction happens more quickly than new bone creation. After menopause, you may be at an increased risk for osteoporosis. To help prevent osteoporosis or the bone fractures that can happen because of osteoporosis, the following is recommended:  If you are 19-50 years old, get at least 1,000 mg of calcium and at least 600 mg of vitamin D per day.  If you are older than age 50 but younger than age 70, get at least 1,200 mg of calcium and at least 600 mg of vitamin D per day.  If you are older than age 70, get at least 1,200 mg of calcium and at least 800 mg of vitamin D per day. Smoking and excessive alcohol intake increase the risk of osteoporosis. Eat foods that are rich in calcium and vitamin D, and do weight-bearing exercises several times each week as directed by your health care provider. What should I know about how menopause affects my mental health? Depression may occur at any age, but it is more common as you become older. Common symptoms of depression include:  Low or sad  mood.  Changes in sleep patterns.  Changes in appetite or eating patterns.  Feeling an overall lack of motivation or enjoyment of activities that you previously enjoyed.  Frequent crying spells. Talk with your health care provider if you think that you are experiencing depression. What should I know about immunizations? It is important that you get and maintain your immunizations. These include:  Tetanus, diphtheria, and pertussis (Tdap) booster vaccine.  Influenza every year before the flu season begins.  Pneumonia vaccine.  Shingles vaccine. Your health care provider may also recommend other immunizations. This information is not intended to replace advice given to you by your health care provider. Make sure you discuss any questions you have with your health care provider. Document Released: 06/07/2005 Document Revised: 11/03/2015 Document Reviewed: 01/17/2015 Elsevier Interactive Patient Education    2019 [place].

## 2018-06-04 ENCOUNTER — Ambulatory Visit (INDEPENDENT_AMBULATORY_CARE_PROVIDER_SITE_OTHER): Payer: BC Managed Care – PPO | Admitting: Psychology

## 2018-06-04 ENCOUNTER — Ambulatory Visit (INDEPENDENT_AMBULATORY_CARE_PROVIDER_SITE_OTHER): Payer: BC Managed Care – PPO | Admitting: Bariatrics

## 2018-06-04 ENCOUNTER — Encounter (INDEPENDENT_AMBULATORY_CARE_PROVIDER_SITE_OTHER): Payer: Self-pay

## 2018-06-04 DIAGNOSIS — F3289 Other specified depressive episodes: Secondary | ICD-10-CM
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
## 2018-06-16 ENCOUNTER — Encounter (INDEPENDENT_AMBULATORY_CARE_PROVIDER_SITE_OTHER): Payer: Self-pay | Admitting: Bariatrics

## 2018-06-16 ENCOUNTER — Ambulatory Visit (INDEPENDENT_AMBULATORY_CARE_PROVIDER_SITE_OTHER): Payer: BC Managed Care – PPO | Admitting: Bariatrics

## 2018-06-16 VITALS — BP 149/73 | HR 72 | Temp 98.0°F | Ht 64.0 in | Wt 257.0 lb

## 2018-06-16 DIAGNOSIS — E119 Type 2 diabetes mellitus without complications: Secondary | ICD-10-CM

## 2018-06-16 DIAGNOSIS — Z6841 Body Mass Index (BMI) 40.0 and over, adult: Secondary | ICD-10-CM | POA: Diagnosis not present

## 2018-06-16 DIAGNOSIS — I10 Essential (primary) hypertension: Secondary | ICD-10-CM | POA: Diagnosis not present

## 2018-06-16 DIAGNOSIS — E559 Vitamin D deficiency, unspecified: Secondary | ICD-10-CM

## 2018-06-16 DIAGNOSIS — Z9189 Other specified personal risk factors, not elsewhere classified: Secondary | ICD-10-CM

## 2018-06-16 MED ORDER — VITAMIN D (ERGOCALCIFEROL) 1.25 MG (50000 UNIT) PO CAPS: 50000.0000 [IU] | ORAL_CAPSULE | ORAL | 0 refills | Status: DC

## 2018-06-16 MED ORDER — VALSARTAN-HYDROCHLOROTHIAZIDE 160-12.5 MG PO TABS: 1.0000 | ORAL_TABLET | Freq: Every day | ORAL | 0 refills | Status: DC

## 2018-06-17 DIAGNOSIS — Z6841 Body Mass Index (BMI) 40.0 and over, adult: Secondary | ICD-10-CM

## 2018-06-17 NOTE — Progress Notes (Signed)
Office: [phone]  /  Fax: [phone]   HPI:   Chief Complaint: OBESITY Cassidy Giles is here to discuss her progress with her obesity treatment plan. She is on the Category 2 plan and is following her eating plan approximately 98 % of the time. She states she is exercising 0 minutes 0 times per week. Cassidy Giles has done well since her last visit. She has consistently followed the plan. Cassidy Giles is not eating fast food for the most part. Her weight is 257 lb (116.6 kg) today and has had a weight loss of 7 pounds over a period of 3 to 4 weeks since her last visit. She has lost 7 lbs since starting treatment with [place].  Hypertension Cassidy Giles is a 53 y.o. female with hypertension. Cassidy Giles denies chest pain. She is working weight loss to help control her blood Giles with the goal of decreasing her risk of heart attack and stroke. Cassidy Giles is well controlled.  Diabetes II Cassidy Giles has a diagnosis of diabetes type II. She is taking metformin. Cassidy Giles does not check blood sugars. Last A1c was at 7.3 and last insulin level was at 11.5 She has been working on intensive lifestyle modifications including diet, exercise, and weight loss to help control her blood glucose levels.  Vitamin D deficiency Cassidy Giles has a diagnosis of vitamin D deficiency. Her last vitamin D level was at 12.8 She is not currently taking vit D and denies nausea, vomiting or muscle weakness.  At risk for osteopenia and osteoporosis Cassidy Giles is at higher risk of osteopenia and osteoporosis due to vitamin D deficiency.   ASSESSMENT AND PLAN:  Essential hypertension - Plan: valsartan-hydrochlorothiazide (DIOVAN HCT) 160-12.5 MG tablet  Type 2 diabetes mellitus without complication, without long-term current use of insulin (HCC)  Vitamin D deficiency - Plan: Vitamin D, Ergocalciferol, (DRISDOL) 1.25 MG (50000 UT) CAPS capsule  At risk for osteoporosis  Class 3 severe obesity with serious comorbidity and body  mass index (BMI) of 40.0 to 44.9 in adult, unspecified obesity type ([place])  PLAN:  Hypertension We discussed sodium restriction, working on healthy weight loss, and a regular exercise program as the means to achieve improved blood Giles control. Cassidy Giles agreed with this plan and agreed to follow up as directed. We will continue to monitor her blood Giles as well as her progress with the above lifestyle modifications. She agrees to take Valsartan-HCTZ 160-125 mg 1 tablet daily #30 with no refills  and will watch for signs of hypotension as she continues her lifestyle modifications.  Diabetes II Cassidy Giles has been given extensive diabetes education by myself today including ideal fasting and post-prandial blood glucose readings, individual ideal Hgb A1c goals  and hypoglycemia prevention. We discussed the importance of good blood sugar control to decrease the likelihood of diabetic complications such as nephropathy, neuropathy, limb loss, blindness, coronary artery disease, and death. We discussed the importance of intensive lifestyle modification including diet, exercise and weight loss as the first line treatment for diabetes. Cassidy Giles will continue metformin and will follow up at the agreed upon time.  Vitamin D Deficiency Cassidy Giles was informed that low vitamin D levels contributes to fatigue and are associated with obesity, breast, and colon cancer. She agrees to start prescription Vit D @50 ,000 IU every week #4 with no refills and will follow up for routine testing of vitamin D, at least 2-3 times per year. She was informed of the risk of over-replacement of vitamin D and agrees to not increase her dose  unless she discusses this with [place] first. Cassidy Giles agrees to follow up as directed.  At risk for osteopenia and osteoporosis Cassidy Giles was given extended  (15 minutes) osteoporosis prevention counseling today. Cassidy Giles is at risk for osteopenia and osteoporosis due to her vitamin D deficiency. She was  encouraged to take her vitamin D and follow her higher calcium diet and increase strengthening exercise to help strengthen her bones and decrease her risk of osteopenia and osteoporosis.  Obesity Cassidy Giles is currently in the action stage of change. As such, her goal is to continue with weight loss efforts She has agreed to follow the Category 2 plan Cassidy Giles has been instructed to work up to a goal of 150 minutes of combined cardio and strengthening exercise per week for weight loss and overall health benefits. We discussed the following Behavioral Modification Strategies today: planning for success, increase H2O intake, no skipping meals, better snacking choices, increasing lean protein intake, decreasing simple carbohydrates, increasing vegetables, work on meal planning and easy cooking plans and travel eating strategies  Additional lunch options were given to patient today.  Cassidy Giles has agreed to follow up with our clinic in 2 weeks. She was informed of the importance of frequent follow up visits to maximize her success with intensive lifestyle modifications for her multiple health conditions.  ALLERGIES: Allergies  Allergen Reactions  . Other     Seasonal allergies    MEDICATIONS: Current Outpatient Medications on File Prior to Visit  Medication Sig Dispense Refill  . magnesium citrate SOLN As directed    . metFORMIN (GLUCOPHAGE-XR) 500 MG 24 hr tablet Take 1 tablet by mouth daily.    . montelukast (SINGULAIR) 10 MG tablet Take 10 mg by mouth at bedtime.      . Multiple Vitamin (MULTIVITAMIN) tablet Take 1 tablet by mouth daily.      Marland Kitchen zolpidem (AMBIEN) 5 MG tablet Take 5 mg by mouth at bedtime as needed.       No current facility-administered medications on file prior to visit.     PAST MEDICAL HISTORY: Past Medical History:  Diagnosis Date  . Anemia   . Diabetes ([place])   . Fibroid   . Gallbladder problem   . High blood Giles   . High cholesterol   . Obesity     PAST  SURGICAL HISTORY: Past Surgical History:  Procedure Laterality Date  . CHOLECYSTECTOMY    . HAND SURGERY     RIGHT  . INTRAUTERINE DEVICE INSERTION  01/06/2006   [facility]  . KNEE SURGERY     RIGHT  . KNEE SURGERY  3/12017    SOCIAL HISTORY: Social History   Tobacco Use  . Smoking status: Never Smoker  . Smokeless tobacco: Never Used  Substance Use Topics  . Alcohol use: Yes    Comment: OCC  . Drug use: No    FAMILY HISTORY: Family History  Problem Relation Age of Onset  . Diabetes Mother   . Hypertension Mother   . Cancer Mother        UTERINE  . Heart disease Mother   . High Cholesterol Mother   . Sudden death Mother   . Stroke Mother   . Depression Mother   . Diabetes Sister   . Hypertension Sister   . Breast cancer Sister   . Diabetes Brother   . Cancer Brother        LUNG/LIVER...DECEASED 2009-08-03  . Hypertension Brother     ROS: Review of Systems  Constitutional: Positive  for weight loss.  Cardiovascular: Negative for chest pain.  Gastrointestinal: Negative for nausea and vomiting.  Musculoskeletal:       Negative for muscle weakness    PHYSICAL EXAM: Blood Giles (!) 149/73, pulse 72, temperature 98 F (36.7 C), temperature source Oral, height 5\' 4"  (1.626 m), weight 257 lb (116.6 kg), last menstrual period 03/21/2017, SpO2 100 %. Body mass index is 44.11 kg/m. Physical Exam Vitals signs reviewed.  Constitutional:      Appearance: Normal appearance. She is well-developed. She is obese.  Cardiovascular:     Rate and Rhythm: Normal rate.  Pulmonary:     Effort: Pulmonary effort is normal.  Musculoskeletal: Normal range of motion.  Skin:    General: Skin is warm and dry.  Neurological:     Mental Status: She is alert and oriented to person, place, and time.  Psychiatric:        Mood and Affect: Mood normal.        Behavior: Behavior normal.     RECENT LABS AND TESTS: BMET    Component Value Date/Time   NA 140 05/21/2018 0938   K 4.5  05/21/2018 0938   CL 98 05/21/2018 0938   CO2 23 05/21/2018 0938   GLUCOSE 141 (H) 05/21/2018 0938   GLUCOSE 144 (H) 09/06/2015 1004   BUN 9 05/21/2018 0938   CREATININE 0.80 05/21/2018 0938   CALCIUM 9.9 05/21/2018 0938   GFRNONAA 84 05/21/2018 0938   GFRAA 97 05/21/2018 0938   Lab Results  Component Value Date   HGBA1C 7.3 (H) 05/21/2018   HGBA1C 6.3 (H) 12/06/2011   Lab Results  Component Value Date   INSULIN 11.5 05/21/2018   CBC    Component Value Date/Time   WBC 5.8 05/21/2018 0938   WBC 7.5 09/06/2015 1004   RBC 4.85 05/21/2018 0938   RBC 4.84 09/06/2015 1004   HGB 12.8 05/21/2018 0938   HCT 38.6 05/21/2018 0938   PLT 260 09/06/2015 1004   MCV 80 05/21/2018 0938   MCH 26.4 (L) 05/21/2018 0938   MCH 26.4 09/06/2015 1004   MCHC 33.2 05/21/2018 0938   MCHC 32.5 09/06/2015 1004   RDW 13.4 05/21/2018 0938   LYMPHSABS 2.5 05/21/2018 0938   MONOABS 0.4 09/06/2015 1004   EOSABS 0.0 05/21/2018 0938   BASOSABS 0.0 05/21/2018 0938   Iron/TIBC/Ferritin/ %Sat No results found for: IRON, TIBC, FERRITIN, IRONPCTSAT Lipid Panel     Component Value Date/Time   CHOL 246 (H) 05/21/2018 0938   TRIG 88 05/21/2018 0938   HDL 83 05/21/2018 0938   LDLCALC 145 (H) 05/21/2018 0938   Hepatic Function Panel     Component Value Date/Time   PROT 7.8 05/21/2018 0938   ALBUMIN 4.6 05/21/2018 0938   AST 15 05/21/2018 0938   ALT 20 05/21/2018 0938   ALKPHOS 61 05/21/2018 0938   BILITOT 0.9 05/21/2018 0938      Component Value Date/Time   TSH 1.550 05/21/2018 0938   TSH 1.694 09/27/2014 1215     Ref. Range 05/21/2018 09:38  Vitamin D, 25-Hydroxy Latest Ref Range: 30.0 - 100.0 ng/mL 12.8 (L)     OBESITY BEHAVIORAL INTERVENTION VISIT  Today's visit was # 2   Starting weight: 264 lbs Starting date: 05/21/2018 Today's weight : 257 lbs Today's date: 06/16/2018 Total lbs lost to date: 7    06/16/2018  Weight 257 lb (116.6 kg)    ASK: We discussed the diagnosis of  obesity with Cassidy Giles today and  Cassidy Giles agreed to give [place] permission to discuss obesity behavioral modification therapy today.  ASSESS: Cassidy Giles has the diagnosis of obesity and her BMI today is 44.09 Cassidy Giles is in the action stage of change   ADVISE: Cassidy Giles was educated on the multiple health risks of obesity as well as the benefit of weight loss to improve her health. She was advised of the need for long term treatment and the importance of lifestyle modifications to improve her current health and to decrease her risk of future health problems.  AGREE: Multiple dietary modification options and treatment options were discussed and  Cassidy Giles agreed to follow the recommendations documented in the above note.  ARRANGE: Cassidy Giles was educated on the importance of frequent visits to treat obesity as outlined per CMS and USPSTF guidelines and agreed to schedule her next follow up appointment today.  Corey Skains, am acting as Location manager for [employer]. Owens Shark, DO  I have reviewed the above documentation for accuracy and completeness, and I agree with the above. -Jearld Lesch, DO

## 2018-06-18 ENCOUNTER — Ambulatory Visit (INDEPENDENT_AMBULATORY_CARE_PROVIDER_SITE_OTHER): Payer: BC Managed Care – PPO | Admitting: Psychology

## 2018-06-18 DIAGNOSIS — F3289 Other specified depressive episodes: Secondary | ICD-10-CM

## 2018-06-18 NOTE — Progress Notes (Signed)
Office: [phone]  /  Fax: [phone]    Date: June 18, 2018   Time Seen: 8:00am Duration: 25 minutes Provider: Glennie Isle, Psy.D. Type of Session: Individual Therapy  Type of Contact: Face-to-face  Session Content: Cassidy Giles is a 53 y.o. female presenting for a follow-up appointment to address the previously established treatment goal of decreasing emotional eating. The session was initiated with the administration of the PHQ-9 and GAD-7, as well as a brief check-in. Cassidy Giles shared she lost seven pounds, but shared she continues to experience cravings. She recalled having cravings at work when stressed, and at home while watching television. When experiencing cravings, Cassidy Giles reported eating, but noted she engaged in portion control. This provider and Cassidy Giles further discussed using snack calories for sweets and other cravings. Overall, Cassidy Giles reported a decrease in emotional eating due to increased awareness. Psychoeducation regarding triggers for emotional eating was provided. Cassidy Giles was provided a handout, and encouraged to utilize the handout between now and the next appointment to increase awareness of triggers and frequency. Cassidy Giles agreed. This provider also discussed behavioral strategies for specific triggers, such as placing the utensil down when conversing to avoid mindless eating. Cassidy Giles was receptive to today's session as evidenced by openness to sharing, responsiveness to feedback, and willingness to identify her triggers for emotional eating.  Mental Status Examination: Cassidy Giles arrived on time for the appointment. She presented as appropriately dressed and groomed. Cassidy Giles appeared her stated age and demonstrated adequate orientation to time, place, person, and purpose of the appointment. She also demonstrated appropriate eye contact. No psychomotor abnormalities or behavioral peculiarities noted. Her mood was euthymic with congruent affect. Her thought processes were logical,  linear, and goal-directed. No hallucinations, delusions, bizarre thinking or behavior reported or observed. Judgment, insight, and impulse control appeared to be grossly intact. There was no evidence of paraphasias (i.e., errors in speech, gross mispronunciations, and word substitutions), repetition deficits, or disturbances in volume or prosody (i.e., rhythm and intonation). There was no evidence of attention or memory impairments. Cassidy Giles denied current suicidal and homicidal ideation, plan and intent.   Structured Assessment Results: The Patient Health Questionnaire-9 (PHQ-9) is a self-report measure that assesses symptoms and severity of depression over the course of the last two weeks. Cassidy Giles obtained a score of 6 suggesting mild depression. Cassidy Giles finds the endorsed symptoms to be somewhat difficult. Depression screen [facility] 2/9 06/18/2018  Decreased Interest 1  Down, Depressed, Hopeless 0  PHQ - 2 Score 1  Altered sleeping 2  Tired, decreased energy 2  Change in appetite 0  Feeling bad or failure about yourself  0  Trouble concentrating 1  Moving slowly or fidgety/restless 0  Suicidal thoughts 0  PHQ-9 Score 6  Difficult doing work/chores -   The Generalized Anxiety Disorder-7 (GAD-7) is a brief self-report measure that assesses symptoms of anxiety over the course of the last two weeks. Cassidy Giles obtained a score of zero. GAD 7 : Generalized Anxiety Score 06/18/2018  Nervous, Anxious, on Edge 0  Control/stop worrying 0  Worry too much - different things 0  Trouble relaxing 0  Restless 0  Easily annoyed or irritable 0  Afraid - awful might happen 0  Total GAD 7 Score 0  Anxiety Difficulty -   Interventions:  Administration of PHQ-9 and GAD-7 for symptom monitoring Review of content from the previous session Empathic reflections and validation Psychoeducation regarding triggers for emotional eating Rapport building Brief chart review  DSM-5 Diagnosis: 311 (F32.8) Other Specified  Depressive Disorder, Emotional Eating  Behaviors  Treatment Goal & Progress: During the initial appointment with this provider, the following treatment goal was established: decrease emotional eating. Progress is limited, as Cassidy Giles has just begun treatment with this provider; however, she is receptive to the interaction and interventions and rapport is being established. Nevertheless, Cassidy Giles has demonstrated progress in her goal as evidenced by increased awareness of hunger patterns. She also noted a decrease in emotional eating due to increased awareness.  Plan: Cassidy Giles continues to appear able and willing to participate as evidenced by engagement in reciprocal conversation, and asking questions for clarification as appropriate. The next appointment will be scheduled in two weeks. The next session will focus on reviewing triggers for emotional eating and working towards the established treatment goal.

## 2018-06-24 ENCOUNTER — Other Ambulatory Visit (INDEPENDENT_AMBULATORY_CARE_PROVIDER_SITE_OTHER): Payer: Self-pay | Admitting: Bariatrics

## 2018-06-24 DIAGNOSIS — I10 Essential (primary) hypertension: Secondary | ICD-10-CM

## 2018-06-24 MED ORDER — VALSARTAN-HYDROCHLOROTHIAZIDE 160-12.5 MG PO TABS: 1.0000 | ORAL_TABLET | Freq: Every day | ORAL | 0 refills | Status: DC

## 2018-07-01 ENCOUNTER — Ambulatory Visit (INDEPENDENT_AMBULATORY_CARE_PROVIDER_SITE_OTHER): Payer: BC Managed Care – PPO | Admitting: Bariatrics

## 2018-07-01 VITALS — BP 148/81 | HR 87 | Temp 97.5°F | Ht 64.0 in | Wt 261.0 lb

## 2018-07-01 DIAGNOSIS — I10 Essential (primary) hypertension: Secondary | ICD-10-CM

## 2018-07-01 DIAGNOSIS — Z6841 Body Mass Index (BMI) 40.0 and over, adult: Secondary | ICD-10-CM

## 2018-07-01 DIAGNOSIS — E119 Type 2 diabetes mellitus without complications: Secondary | ICD-10-CM

## 2018-07-01 DIAGNOSIS — E559 Vitamin D deficiency, unspecified: Secondary | ICD-10-CM

## 2018-07-01 DIAGNOSIS — Z9189 Other specified personal risk factors, not elsewhere classified: Secondary | ICD-10-CM | POA: Diagnosis not present

## 2018-07-01 MED ORDER — VITAMIN D (ERGOCALCIFEROL) 1.25 MG (50000 UNIT) PO CAPS: 50000.0000 [IU] | ORAL_CAPSULE | ORAL | 0 refills | Status: DC

## 2018-07-06 DIAGNOSIS — E559 Vitamin D deficiency, unspecified: Secondary | ICD-10-CM | POA: Insufficient documentation

## 2018-07-06 NOTE — Progress Notes (Signed)
Office: [phone]  /  Fax: [phone]   HPI:   Chief Complaint: OBESITY Cassidy Giles is here to discuss her progress with her obesity treatment plan. She is on the Category 2 plan and is following her eating plan approximately 99 % of the time. She states she is exercising 0 minutes 0 times per week. Cassidy Giles has been doing well. She is up almost four pounds in water weight. Cassidy Giles ate out last Saturday. Her weight is 261 lb (118.4 kg) today and has had a weight gain of 4 pounds over a period of 2 weeks since her last visit. She has lost 3 lbs since starting treatment with [place].  Diabetes II Cassidy Giles has a diagnosis of diabetes type II. She is currently taking metformin. Cassidy Giles does not check her blood sugar at home and she denies any hypoglycemic episodes. Last A1c was at 7.3 and last insulin level was at 11.5 She has been working on intensive lifestyle modifications including diet, exercise, and weight loss to help control her blood glucose levels.  Hypertension Cassidy Giles is a 53 y.o. female with hypertension. She is currently taking Diovan HCT. Cassidy Giles denies chest pain. She is working weight loss to help control her blood pressure with the goal of decreasing her risk of heart attack and stroke. [place]ails blood pressure is currently controlled.  Vitamin D deficiency Cassidy Giles has a diagnosis of vitamin D deficiency. She is currently taking high dose vit D and denies nausea, vomiting or muscle weakness.  At risk for osteopenia and osteoporosis Cassidy Giles is at higher risk of osteopenia and osteoporosis due to vitamin D deficiency.   ASSESSMENT AND PLAN:  Type 2 diabetes mellitus without complication, without long-term current use of insulin (HCC)  Essential hypertension  Vitamin D deficiency - Plan: Vitamin D, Ergocalciferol, (DRISDOL) 1.25 MG (50000 UT) CAPS capsule  At risk for osteoporosis  Class 3 severe obesity with serious comorbidity and body mass index (BMI) of 40.0 to  44.9 in adult, unspecified obesity type ([place])  PLAN:  Diabetes II Cassidy Giles has been given extensive diabetes education by myself today including ideal fasting and post-prandial blood glucose readings, individual ideal Hgb A1c goals and hypoglycemia prevention. We discussed the importance of good blood sugar control to decrease the likelihood of diabetic complications such as nephropathy, neuropathy, limb loss, blindness, coronary artery disease, and death. We discussed the importance of intensive lifestyle modification including diet, exercise and weight loss as the first line treatment for diabetes. Cassidy Giles agrees to continue metformin and will follow up at the agreed upon time.  Hypertension We discussed sodium restriction, working on healthy weight loss, and a regular exercise program as the means to achieve improved blood pressure control. Cassidy Giles agreed with this plan and agreed to follow up as directed. We will continue to monitor her blood pressure as well as her progress with the above lifestyle modifications. She will continue her medications as prescribed and will watch for signs of hypotension as she continues her lifestyle modifications.  Vitamin D Deficiency Cassidy Giles was informed that low vitamin D levels contributes to fatigue and are associated with obesity, breast, and colon cancer. She agrees to continue to take prescription Vit D @50 ,000 IU every week #4 with no refills and will follow up for routine testing of vitamin D, at least 2-3 times per year. She was informed of the risk of over-replacement of vitamin D and agrees to not increase her dose unless she discusses this with [place] first. Cassidy Giles agrees to follow up  as directed.  At risk for osteopenia and osteoporosis Cassidy Giles was given extended  (15 minutes) osteoporosis prevention counseling today. Cassidy Giles is at risk for osteopenia and osteoporosis due to her vitamin D deficiency. She was encouraged to take her vitamin D and follow her higher  calcium diet and increase strengthening exercise to help strengthen her bones and decrease her risk of osteopenia and osteoporosis.  Obesity Cassidy Giles is currently in the action stage of change. As such, her goal is to continue with weight loss efforts She has agreed to follow the Category 2 plan and cut back to 1 slice daily Cassidy Giles will increase her steps for weight loss and overall health benefits. We discussed the following Behavioral Modification Strategies today: increase H2O intake, no skipping meals, keeping healthy foods in the home, increasing lean protein intake, decreasing simple carbohydrates, increasing vegetables, decrease eating out, work on meal planning and easy cooking plans and decrease liquid calories  Cassidy Giles has agreed to follow up with our clinic in 2 weeks. She was informed of the importance of frequent follow up visits to maximize her success with intensive lifestyle modifications for her multiple health conditions.  ALLERGIES: Allergies  Allergen Reactions  . Other     Seasonal allergies    MEDICATIONS: Current Outpatient Medications on File Prior to Visit  Medication Sig Dispense Refill  . magnesium citrate SOLN As directed    . metFORMIN (GLUCOPHAGE-XR) 500 MG 24 hr tablet Take 1 tablet by mouth daily.    . montelukast (SINGULAIR) 10 MG tablet Take 10 mg by mouth at bedtime.      . Multiple Vitamin (MULTIVITAMIN) tablet Take 1 tablet by mouth daily.      . valsartan-hydrochlorothiazide (DIOVAN HCT) 160-12.5 MG tablet Take 1 tablet by mouth daily. 30 tablet 0  . zolpidem (AMBIEN) 5 MG tablet Take 5 mg by mouth at bedtime as needed.       No current facility-administered medications on file prior to visit.     PAST MEDICAL HISTORY: Past Medical History:  Diagnosis Date  . Anemia   . Diabetes ([place])   . Fibroid   . Gallbladder problem   . High blood pressure   . High cholesterol   . Obesity     PAST SURGICAL HISTORY: Past Surgical History:  Procedure  Laterality Date  . CHOLECYSTECTOMY    . HAND SURGERY     RIGHT  . INTRAUTERINE DEVICE INSERTION  01/06/2006   MIRENA  . KNEE SURGERY     RIGHT  . KNEE SURGERY  3/12017    SOCIAL HISTORY: Social History   Tobacco Use  . Smoking status: Never Smoker  . Smokeless tobacco: Never Used  Substance Use Topics  . Alcohol use: Yes    Comment: OCC  . Drug use: No    FAMILY HISTORY: Family History  Problem Relation Age of Onset  . Diabetes Mother   . Hypertension Mother   . Cancer Mother        UTERINE  . Heart disease Mother   . High Cholesterol Mother   . Sudden death Mother   . Stroke Mother   . Depression Mother   . Diabetes Sister   . Hypertension Sister   . Breast cancer Sister   . Diabetes Brother   . Cancer Brother        LUNG/LIVER...DECEASED 08-11-09  . Hypertension Brother     ROS: Review of Systems  Constitutional: Negative for weight loss.  Cardiovascular: Negative for chest pain.  Gastrointestinal: Negative for nausea and vomiting.  Musculoskeletal:       Negative for muscle weakness  Endo/Heme/Allergies:       Negative for hypoglycemia    PHYSICAL EXAM: Blood pressure (!) 148/81, pulse 87, temperature (!) 97.5 F (36.4 C), temperature source Oral, height 5\' 4"  (1.626 m), weight 261 lb (118.4 kg), last menstrual period 03/21/2017, SpO2 100 %. Body mass index is 44.8 kg/m. Physical Exam Vitals signs reviewed.  Constitutional:      Appearance: Normal appearance. She is well-developed. She is obese.  Cardiovascular:     Rate and Rhythm: Normal rate.  Pulmonary:     Effort: Pulmonary effort is normal.  Musculoskeletal: Normal range of motion.  Skin:    General: Skin is warm and dry.  Neurological:     Mental Status: She is alert and oriented to person, place, and time.  Psychiatric:        Mood and Affect: Mood normal.        Behavior: Behavior normal.     RECENT LABS AND TESTS: BMET    Component Value Date/Time   NA 140 05/21/2018 0938   K  4.5 05/21/2018 0938   CL 98 05/21/2018 0938   CO2 23 05/21/2018 0938   GLUCOSE 141 (H) 05/21/2018 0938   GLUCOSE 144 (H) 09/06/2015 1004   BUN 9 05/21/2018 0938   CREATININE 0.80 05/21/2018 0938   CALCIUM 9.9 05/21/2018 0938   GFRNONAA 84 05/21/2018 0938   GFRAA 97 05/21/2018 0938   Lab Results  Component Value Date   HGBA1C 7.3 (H) 05/21/2018   HGBA1C 6.3 (H) 12/06/2011   Lab Results  Component Value Date   INSULIN 11.5 05/21/2018   CBC    Component Value Date/Time   WBC 5.8 05/21/2018 0938   WBC 7.5 09/06/2015 1004   RBC 4.85 05/21/2018 0938   RBC 4.84 09/06/2015 1004   HGB 12.8 05/21/2018 0938   HCT 38.6 05/21/2018 0938   PLT 260 09/06/2015 1004   MCV 80 05/21/2018 0938   MCH 26.4 (L) 05/21/2018 0938   MCH 26.4 09/06/2015 1004   MCHC 33.2 05/21/2018 0938   MCHC 32.5 09/06/2015 1004   RDW 13.4 05/21/2018 0938   LYMPHSABS 2.5 05/21/2018 0938   MONOABS 0.4 09/06/2015 1004   EOSABS 0.0 05/21/2018 0938   BASOSABS 0.0 05/21/2018 0938   Iron/TIBC/Ferritin/ %Sat No results found for: IRON, TIBC, FERRITIN, IRONPCTSAT Lipid Panel     Component Value Date/Time   CHOL 246 (H) 05/21/2018 0938   TRIG 88 05/21/2018 0938   HDL 83 05/21/2018 0938   LDLCALC 145 (H) 05/21/2018 0938   Hepatic Function Panel     Component Value Date/Time   PROT 7.8 05/21/2018 0938   ALBUMIN 4.6 05/21/2018 0938   AST 15 05/21/2018 0938   ALT 20 05/21/2018 0938   ALKPHOS 61 05/21/2018 0938   BILITOT 0.9 05/21/2018 0938      Component Value Date/Time   TSH 1.550 05/21/2018 0938   TSH 1.694 09/27/2014 1215     Ref. Range 05/21/2018 09:38  Vitamin D, 25-Hydroxy Latest Ref Range: 30.0 - 100.0 ng/mL 12.8 (L)     OBESITY BEHAVIORAL INTERVENTION VISIT  Today's visit was # 3   Starting weight: 264 lbs Starting date: 05/21/2018 Today's weight : 261 lbs Today's date: 07/01/2018 Total lbs lost to date: 3    07/01/2018  Height 5\' 4"  (1.626 m)  Weight 261 lb (118.4 kg)  BMI (Calculated)  44.78  BLOOD PRESSURE -  SYSTOLIC 782  BLOOD PRESSURE - DIASTOLIC 81   Body Fat % 95.6 %  Total Body Water (lbs) 91.2 lbs    ASK: We discussed the diagnosis of obesity with Cassidy Giles today and Sejla agreed to give [place] permission to discuss obesity behavioral modification therapy today.  ASSESS: Cassidy Giles has the diagnosis of obesity and her BMI today is 44.78 Cassidy Giles is in the action stage of change   ADVISE: Cassidy Giles was educated on the multiple health risks of obesity as well as the benefit of weight loss to improve her health. She was advised of the need for long term treatment and the importance of lifestyle modifications to improve her current health and to decrease her risk of future health problems.  AGREE: Multiple dietary modification options and treatment options were discussed and  Cassidy Giles agreed to follow the recommendations documented in the above note.  ARRANGE: Shakyra was educated on the importance of frequent visits to treat obesity as outlined per CMS and USPSTF guidelines and agreed to schedule her next follow up appointment today.  Corey Skains, am acting as Location manager for [employer]. Owens Shark, DO  I have reviewed the above documentation for accuracy and completeness, and I agree with the above. -Jearld Lesch, DO

## 2018-07-06 NOTE — Progress Notes (Unsigned)
  Office: [phone]  /  Fax: [phone]    Date: 07/07/2018   Time Seen:*** Duration:*** Provider: Glennie Isle, Psy.D. Type of Session: Individual Therapy  Type of Contact: Face-to-face  Session Content: Cassidy Giles is a 53 y.o. female presenting for a follow-up appointment to address the previously established treatment goal of decreasing emotional eating. The session was initiated with the administration of the PHQ-9 and GAD-7, as well as a brief check-in. *** Cassidy Giles was receptive to today's session as evidenced by openness to sharing, responsiveness to feedback, and ***.  Mental Status Examination: Cassidy Giles arrived on time for the appointment. She presented as appropriately dressed and groomed. Cassidy Giles appeared her stated age and demonstrated adequate orientation to time, place, person, and purpose of the appointment. She also demonstrated appropriate eye contact. No psychomotor abnormalities or behavioral peculiarities noted. Her mood was {gbmood:21757} with congruent affect. Her thought processes were logical, linear, and goal-directed. No hallucinations, delusions, bizarre thinking or behavior reported or observed. Judgment, insight, and impulse control appeared to be grossly intact. There was no evidence of paraphasias (i.e., errors in speech, gross mispronunciations, and word substitutions), repetition deficits, or disturbances in volume or prosody (i.e., rhythm and intonation). There was no evidence of attention or memory impairments. Cassidy Giles denied current suicidal and homicidal ideation, plan and intent.   Structured Assessment Results: The Patient Health Questionnaire-9 (PHQ-9) is a self-report measure that assesses symptoms and severity of depression over the course of the last two weeks. Cassidy Giles obtained a score of *** suggesting {GBPHQ9SEVERITY:21752}. Cassidy Giles finds the endorsed symptoms to be {gbphq9difficulty:21754}.  The Generalized Anxiety Disorder-7 (GAD-7) is a brief self-report  measure that assesses symptoms of anxiety over the course of the last two weeks. Cassidy Giles obtained a score of *** suggesting {gbgad7severity:21753}.  Interventions:  {Interventions:22172}  DSM-5 Diagnosis: 311 (F32.8) Other Specified Depressive Disorder, Emotional Eating Behaviors  Treatment Goal & Progress: During the initial appointment with this provider, the following treatment goal was established: decrease emotional eating. Cassidy Giles has demonstrated progress in her goal as evidenced by ***  Plan: Cassidy Giles continues to appear able and willing to participate as evidenced by engagement in reciprocal conversation, and asking questions for clarification as appropriate.*** The next appointment will be scheduled in {gbweeks:21758}. The next session will focus on reviewing learned skills, and working towards the established treatment goal.***

## 2018-07-07 ENCOUNTER — Encounter (INDEPENDENT_AMBULATORY_CARE_PROVIDER_SITE_OTHER): Payer: Self-pay

## 2018-07-07 ENCOUNTER — Ambulatory Visit (INDEPENDENT_AMBULATORY_CARE_PROVIDER_SITE_OTHER): Payer: BC Managed Care – PPO | Admitting: Psychology

## 2018-07-22 ENCOUNTER — Encounter (INDEPENDENT_AMBULATORY_CARE_PROVIDER_SITE_OTHER): Payer: Self-pay

## 2018-07-22 ENCOUNTER — Ambulatory Visit (INDEPENDENT_AMBULATORY_CARE_PROVIDER_SITE_OTHER): Payer: Self-pay | Admitting: Bariatrics

## 2018-07-30 ENCOUNTER — Other Ambulatory Visit (INDEPENDENT_AMBULATORY_CARE_PROVIDER_SITE_OTHER): Payer: Self-pay | Admitting: Family Medicine

## 2018-07-30 ENCOUNTER — Encounter (INDEPENDENT_AMBULATORY_CARE_PROVIDER_SITE_OTHER): Payer: Self-pay

## 2018-07-30 DIAGNOSIS — I10 Essential (primary) hypertension: Secondary | ICD-10-CM

## 2018-08-06 ENCOUNTER — Telehealth (INDEPENDENT_AMBULATORY_CARE_PROVIDER_SITE_OTHER): Payer: Self-pay | Admitting: Psychology

## 2018-08-06 NOTE — Telephone Encounter (Signed)
  Office: [phone]  /  Fax: [phone]  Date of Call: August 06, 2018 Time of Call: 9:37am Duration of Call: ~ 5 minutes Provider: Glennie Isle, PsyD  CONTENT: This provider called Cassidy Giles to check-in and schedule a follow-up appointment, as Cassidy Giles missed her last scheduled appointment with this provider. Cassidy Giles was receptive to scheduling a follow-up appointment; therefore, this provider confirmed Cassidy Giles's e-mail address and provided instructions for WebEx appointments. This provider explained an e-mail will be sent with a secure link for the upcoming appointment, and the e-mail will also have an informed consent document for telepsychological services. This provider requested it be completed and returned. Cassidy Giles verbally acknowledged understanding that sending the consent document via a MyChart message will make it part of her medical record; therefore, visible to all providers. A brief risk assessment was completed. Cassidy Giles denied experiencing suicidal and homicidal ideation, plan, and intent since the last appointment with this provider.   PLAN: Cassidy Giles is scheduled for a follow-up appointment via WebEx on August 13, 2018 at 10:30am.

## 2018-08-11 NOTE — Progress Notes (Signed)
Office: [phone]  /  Fax: [phone]    Date: August 13, 2018   Appointment Start Time: 10:39am  Duration: 24 minutes Provider: Glennie Isle, Psy.D. Type of Session: Individual Therapy  Location of Patient: Home-bedroom Location of Provider: Home  Type of Contact: Telepsychological Visit via [employer]   Session Content: Prior to initiating telepsychological services, Cassidy Giles was provided with an informed consent document via e-mail, which included the development of a safety plan (I.e., an emergency contact and emergency resources) in the event of an emergency/crisis. Cassidy Giles was unable to complete the form and return it to this provider prior to today's appointment. As such, this provider verbally discussed the consent form during today's appointment. Cassidy Giles provided an emergency contact, and verbally acknowledged understanding that she is ultimately responsible for understanding her insurance benefits as it relates to reimbursement of telepsychological services. This provider also reviewed confidentiality, as it relates to telepsychological services, as well as the rationale for telepsychological services. More specifically, this provider's clinic is closed for in-person visits due to COVID-19. Therapeutic services will resume to in-person appointments once the clinic re-opens. [name] expressed understanding regarding the rationale for telepsychological services. In addition, this provider explained the telepsychological services informed consent document would be considered an addendum to the initial consent document. [name] verbally consented to proceed, and indicated she would return the signed consent form via a MyChart message. Regarding MyChart messages, [name] verbally acknowledged understanding that any messages sent would be a part of her electronic medical record; therefore, visible to all providers.   It is important to note this provider called [name] at 10:32am, as she did not  present for the WebEx appointment. She indicated she was unsure how to connect; therefore, assistance was provided. This provider called [name] again at 10:37am, as there were audio issues and this provider requested [name] leave and re-join the appointment, which resolved the issue. Thus, the appointment was initiated at 10:39am.   Cassidy Giles is a 53 y.o. female presenting via [employer] [facility] for a follow-up appointment to address the previously established treatment goal of decreasing emotional eating. Prior to proceeding with today's appointment, Cassidy Giles's physical location at the time of this appointment was obtained. Cassidy Giles reported she was at home in her bedroom and provided the address. In the event of technical difficulties, [name] shared a phone number she could be reached at. Cassidy Giles and this provider participated in today's telepsychological service. Also, [name] denied anyone else being present in the room or on the [employer]. After discussing the consent form for telepsychological services, this provider verbally administered the PHQ-9 and GAD-7, and conducted a brief check-in. [name] noted, "I have to be honest, I feel like I am at square one [referring to eating habits]." She discussed eating cookies recently, as it is her "comfort food. 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 As such, identified boredom as being a trigger for emotional eating. This provider reviewed the other triggers for emotional eating, and Cassidy Giles noted unpleasant emotions and stress are also triggers for her. Moreover, Cassidy Giles shared, "I stress out when I have to go to the grocery store" due to COVID-19. She also noted difficulty finding certain foods on the structured meal plan. As such, this provider recommended Cassidy Giles explore delivery services and recommended she reach out to [name] to discuss alternatives for foods she is having difficulty finding. Cassidy Giles agreed. Furthermore, today's  appointment focused on psychoeducation regarding pleasurable activities, including its impact on emotional eating and overall well-being. Cassidy Giles  was provided with a handout via a MyChart message with various options of pleasurable activities, and was encouraged to engage in one activity a day and additional activities when triggered to eat emotionally. Cassidy Giles agreed. Prior to sending the [place] message, this provider explained the message would be visible to all providers, as it would be part of the electronic medical record. Cassidy Giles verbally acknowledged understanding, and verbally consented to this provider sending the MyChart message. Cassidy Giles was receptive to today's session as evidenced by openness to sharing, responsiveness to feedback, and willingness to engage in pleasurable activities.  Mental Status Examination:  Appearance: neat Behavior: cooperative Mood: euthymic Affect: mood congruent Speech: normal in rate, volume, and tone Eye Contact: appropriate Psychomotor Activity: appropriate Thought Process: linear, logical, and goal directed  Content/Perceptual Disturbances: denies suicidal and homicidal ideation, plan, and intent and no hallucinations, delusions, bizarre thinking or behavior reported or observed Orientation: time, person, place and purpose of appointment Cognition/Sensorium: memory, attention, language, and fund of knowledge intact  Insight: fair Judgment: fair  Structured Assessment Results: The Patient Health Questionnaire-9 (PHQ-9) is a self-report measure that assesses symptoms and severity of depression over the course of the last two weeks. Cassidy Giles obtained a score of 4 suggesting minimal depression. Cassidy Giles finds the endorsed symptoms to be not difficult at all. Depression screen [facility] 2/9 08/13/2018  Decreased Interest 0  Down, Depressed, Hopeless 1  PHQ - 2 Score 1  Altered sleeping 2  Tired, decreased energy 0  Change in appetite 0  Feeling bad or failure about  yourself  0  Trouble concentrating 0  Moving slowly or fidgety/restless 1  Suicidal thoughts 0  PHQ-9 Score 4  Difficult doing work/chores -   The Generalized Anxiety Disorder-7 (GAD-7) is a brief self-report measure that assesses symptoms of anxiety over the course of the last two weeks. Denee obtained a score of 5 suggesting mild anxiety. GAD 7 : Generalized Anxiety Score 08/13/2018  Nervous, Anxious, on Edge 1  Control/stop worrying 2  Worry too much - different things 0  Trouble relaxing 1  Restless 0  Easily annoyed or irritable 0  Afraid - awful might happen 1  Total GAD 7 Score 5  Anxiety Difficulty Not difficult at all   Interventions:  Administration of PHQ-9 and GAD-7 for symptom monitoring Review of content from the previous session Empathic reflections and validation Psychoeducation regarding pleasurable activities Brief chart review Employed supportive psychotherapy interventions today to facilitate reduced distress, and to improve coping skills with identified stressors  DSM-5 Diagnosis: 311 (F32.8) Other Specified Depressive Disorder, Emotional Eating Behaviors  Treatment Goal & Progress: During the initial appointment with this provider, the following treatment goal was established: decrease emotional eating. Ruchy has demonstrated some progress in her goal as evidenced by increased awareness of hunger patterns and triggers for emotional eating. She also demonstrates willingness to engage in pleasurable activities to assist with coping.  Plan: Katelin continues to appear able and willing to participate as evidenced by engagement in reciprocal conversation, and asking questions for clarification as appropriate. The next appointment will be scheduled in two weeks, which will be via [employer]. Once this provider's office resumes in-person appointments, Lashawndra will be notified. The next session will focus on reviewing pleasurable activities, and the introduction of  mindfulness.

## 2018-08-13 ENCOUNTER — Other Ambulatory Visit: Payer: Self-pay

## 2018-08-13 ENCOUNTER — Ambulatory Visit (INDEPENDENT_AMBULATORY_CARE_PROVIDER_SITE_OTHER): Payer: BC Managed Care – PPO | Admitting: Psychology

## 2018-08-13 ENCOUNTER — Encounter (INDEPENDENT_AMBULATORY_CARE_PROVIDER_SITE_OTHER): Payer: Self-pay

## 2018-08-13 DIAGNOSIS — F3289 Other specified depressive episodes: Secondary | ICD-10-CM

## 2018-08-27 ENCOUNTER — Ambulatory Visit (INDEPENDENT_AMBULATORY_CARE_PROVIDER_SITE_OTHER): Payer: BC Managed Care – PPO | Admitting: Psychology

## 2018-08-27 ENCOUNTER — Other Ambulatory Visit: Payer: Self-pay

## 2018-08-27 DIAGNOSIS — F3289 Other specified depressive episodes: Secondary | ICD-10-CM | POA: Diagnosis not present

## 2018-08-27 NOTE — Progress Notes (Signed)
Office: [phone]  /  Fax: [phone]    Date: August 27, 2018   Appointment Start Time: 11:31am Duration: 29 minutes Provider: Glennie Isle, Psy.D. Type of Session: Individual Therapy  Location of Patient: Home Location of Provider: [facility] Type of Contact: Telepsychological Visit via [employer] WebEx   Session Content: Cassidy Giles is a 53 y.o. female presenting via [place] for a follow-up appointment to address the previously established treatment goal of decreasing emotional eating. Today's appointment was a telepsychological visit, as this provider's clinic is closed for in-person visits due to COVID-19. Therapeutic services will resume to in-person appointments once the clinic re-opens. [name] expressed understanding regarding the rationale for telepsychological services, and provided verbal consent for today's appointment. Prior to proceeding with today's appointment, [name]'s physical location at the time of this appointment was obtained. [name] reported she was at home and provided the address. In the event of technical difficulties, [name] shared a phone number she could be reached at. [name] and this provider participated in today's telepsychological service. Also, [name] denied anyone else being present in the room or on the WebEx appointment.  This provider conducted a brief check-in and verbally administered the PHQ-9 and GAD-7. [name] shared, "Things have been better" since the last appointment with this provider. She shared a reduction in cravings. [name] shared she is "being a lot more busy with work." She also discussed eating congruent to the meal plan and discussed a decrease in emotional eating. Notably, [name] reported having difficulty falling asleep as news about COVID-19 has been "taking a toll" on her mind. Regarding pleasurable activities, [name] noted, "What pleasurable activities? We can't do anything." Thus, the handout from the last appointment  was reviewed. She discussed walking, listening to music while driving, and vacuuming when triggered to eat emotionally. She demonstrated willingness to continue engaging in pleasurable activities on a daily basis to assist with overall coping. Additionally, psychoeducation regarding mindfulness was provided. A handout was provided to [name] via e-mail with further information regarding mindfulness, including exercises. This provider also explained the benefit of mindfulness as it relates to emotional eating and overall well-being. [name] was encouraged to engage in the provided exercises between now and the next appointment with this provider. [name] agreed. She was also led through an exercise involving her senses. Furthermore, psychoeducation regarding an app called COVID coach was provided to help with mindfulness as well as stressors related to the pandemic. Session concluded with this provider and [name] discussed termination planning. At the onset of therapeutic services the duration of treatment was discussed to be a total of 4 to 6 sessions; however, due to the pandemic, this provider will continue meeting [place] as deemed necessary and appropriate. [name] verbally acknowledged understanding of the aforementioned, and was receptive to double-checking insurance benefits.Overall, [name] was receptive to today's session as evidenced by openness to sharing, responsiveness to feedback, and willingness to engage in learned skills.   Of note, [name] was sent the handout for mindfulness and pleasurable activities via e-mail with [name]'s verbal consent.   Mental Status Examination:  Appearance: neat Behavior: cooperative Mood: euthymic Affect: mood congruent Speech: normal in rate, volume, and tone Eye Contact: appropriate Psychomotor Activity: appropriate Thought Process: linear, logical, and goal directed  Content/Perceptual Disturbances: denies suicidal and homicidal ideation, plan, and intent and no  hallucinations, delusions, bizarre thinking or behavior reported or observed Orientation: time, person, place and purpose of appointment Cognition/Sensorium: memory, attention, language, and fund of knowledge intact  Insight: good Judgment:  good  Structured Assessment Results: The Patient Health Questionnaire-9 (PHQ-9) is a self-report measure that assesses symptoms and severity of depression over the course of the last two weeks. [name] obtained a score of 4 suggesting minimal depression. [name] finds the endorsed symptoms to be not difficult at all. Depression screen [facility] 2/9 08/27/2018  Decreased Interest 0  Down, Depressed, Hopeless 0  PHQ - 2 Score 0  Altered sleeping 1  Tired, decreased energy 2  Change in appetite 0  Feeling bad or failure about yourself  0  Trouble concentrating 0  Moving slowly or fidgety/restless 1  Suicidal thoughts 0  PHQ-9 Score 4  Difficult doing work/chores -   The Generalized Anxiety Disorder-7 (GAD-7) is a brief self-report measure that assesses symptoms of anxiety over the course of the last two weeks. [name] obtained a score of 0. GAD 7 : Generalized Anxiety Score 08/27/2018  Nervous, Anxious, on Edge 0  Control/stop worrying 0  Worry too much - different things 0  Trouble relaxing 0  Restless 0  Easily annoyed or irritable 0  Afraid - awful might happen 0  Total GAD 7 Score 0  Anxiety Difficulty -   Interventions:  Administered PHQ-9 and GAD-7 for symptom monitoring Reviewed content from the previous session Provided empathic reflections and validation Psychoeducation provided regarding mindfulness Engaged patient in a mindfulness exercise Discussed termination planning Employed supportive psychotherapy interventions today to facilitate reduced distress, and to improve coping skills with identified stressors Conducted a brief chart review  DSM-5 Diagnosis: 311 (F32.8) Other Specified Depressive Disorder, Emotional Eating Behaviors   Treatment Goal & Progress: During the initial appointment with this provider, the following treatment goal was established: decrease emotional eating. Zenna has demonstrated progress in her goal as evidenced by increased awareness of hunger patterns and triggers for emotional eating. She also noted a reduction in emotional eating since the last appointment with this provider. Additionally, Amily continues to demonstrate willingness to engage in learned skills.   Plan: Aubriee continues to appear able and willing to participate as evidenced by engagement in reciprocal conversation, and asking questions for clarification as appropriate. The next appointment will be scheduled in two weeks, which will be via [employer]. Once this provider's office resumes in-person appointments, Cherry will be notified. The next session will focus further on mindfulness.

## 2018-09-11 ENCOUNTER — Encounter (INDEPENDENT_AMBULATORY_CARE_PROVIDER_SITE_OTHER): Payer: Self-pay | Admitting: Bariatrics

## 2018-09-14 ENCOUNTER — Ambulatory Visit (INDEPENDENT_AMBULATORY_CARE_PROVIDER_SITE_OTHER): Payer: Self-pay | Admitting: Psychology

## 2018-09-23 ENCOUNTER — Encounter (INDEPENDENT_AMBULATORY_CARE_PROVIDER_SITE_OTHER): Payer: Self-pay

## 2018-10-01 ENCOUNTER — Telehealth (INDEPENDENT_AMBULATORY_CARE_PROVIDER_SITE_OTHER): Payer: Self-pay | Admitting: Psychology

## 2018-10-01 NOTE — Telephone Encounter (Signed)
  Office: [phone]  /  Fax: [phone]  Date of Call: October 01, 2018  Time of Call: 4:53pm Provider: Glennie Isle, PsyD  CONTENT: This provider called Cassidy Giles to check-in and schedule a follow-up appointment, as she canceled her appointment on Sep 14, 2018. This provider was unable to leave a voicemail as [employer] box was full.   PLAN: This provider will wait for Cassidy Giles to call back. If deemed necessary, this provider or the provider's clinic will call Cassidy Giles again in approximately one week.

## 2018-10-02 ENCOUNTER — Encounter (INDEPENDENT_AMBULATORY_CARE_PROVIDER_SITE_OTHER): Payer: Self-pay

## 2018-12-08 ENCOUNTER — Telehealth (INDEPENDENT_AMBULATORY_CARE_PROVIDER_SITE_OTHER): Payer: BC Managed Care – PPO | Admitting: Bariatrics

## 2018-12-08 ENCOUNTER — Other Ambulatory Visit: Payer: Self-pay

## 2018-12-08 ENCOUNTER — Encounter (INDEPENDENT_AMBULATORY_CARE_PROVIDER_SITE_OTHER): Payer: Self-pay | Admitting: Bariatrics

## 2018-12-08 DIAGNOSIS — Z6841 Body Mass Index (BMI) 40.0 and over, adult: Secondary | ICD-10-CM

## 2018-12-08 DIAGNOSIS — E119 Type 2 diabetes mellitus without complications: Secondary | ICD-10-CM

## 2018-12-08 DIAGNOSIS — F3289 Other specified depressive episodes: Secondary | ICD-10-CM | POA: Diagnosis not present

## 2018-12-08 DIAGNOSIS — I10 Essential (primary) hypertension: Secondary | ICD-10-CM | POA: Diagnosis not present

## 2018-12-08 MED ORDER — VALSARTAN-HYDROCHLOROTHIAZIDE 160-12.5 MG PO TABS: 1.0000 | ORAL_TABLET | Freq: Every day | ORAL | 0 refills | Status: DC

## 2018-12-10 NOTE — Progress Notes (Signed)
Office: [phone]  /  Fax: [phone] TeleHealth Visit:  Cassidy Giles has verbally consented to this TeleHealth visit today. The patient is located at work, the provider is located at the [employer] [facility] office. The participants in this visit include the listed provider and patient and any and all parties involved. The visit was conducted today via telephone. Cassidy Giles was unable to use realtime audiovisual technology today and the telehealth visit was conducted via telephone.  HPI:   Chief Complaint: OBESITY Cassidy Giles is here to discuss her progress with her obesity treatment plan. She is on the Category 3 plan and is following her eating plan approximately 20 to 30 % of the time. She states she is exercising 0 minutes 0 times per week. Cassidy Giles states that her weight has remained the same (weight 246 to 247 lbs-date not reported). She has denies some stress eating. We were unable to weigh the patient today for this TeleHealth visit. She feels as if she has maintained weight since her last visit. She has lost 17 to 18 lbs since starting treatment with [place].  Hypertension Cassidy Giles is a 53 y.o. female with hypertension. Cassidy Giles denies chest pain or shortness of breath on exertion. She is working weight loss to help control her blood pressure with the goal of decreasing her risk of heart attack and stroke. Cassidy Giles blood pressure is well controlled.  Depression with emotional eating behaviors Cassidy Giles is struggling with emotional eating and using food for comfort to the extent that it is negatively impacting her health. She often snacks when she is not hungry. Cassidy Giles sometimes feels she is out of control and then feels guilty that she made poor food choices. She is not on medications. She has been working on behavior modification techniques to help reduce her emotional eating and has been somewhat successful. She shows no sign of suicidal or homicidal ideations.  Diabetes II  Cassidy Giles has a diagnosis of diabetes type II. Cassidy Giles denies any signs or symptoms of low blood sugar. Her appetite is normal. Last A1c was at 7.3 She has been working on intensive lifestyle modifications including diet, exercise, and weight loss to help control her blood glucose levels.  ASSESSMENT AND PLAN:  Other depression  Essential hypertension - Plan: valsartan-hydrochlorothiazide (DIOVAN HCT) 160-12.5 MG tablet  Type 2 diabetes mellitus without complication, without long-term current use of insulin (Cassidy)  Class 3 severe obesity with serious comorbidity and body mass index (BMI) of 40.0 to 44.9 in adult, unspecified obesity type ([name])  PLAN:  Hypertension We discussed sodium restriction, working on healthy weight loss, and a regular exercise program as the means to achieve improved blood pressure control. Kit agreed with this plan and agreed to follow up as directed. We will continue to monitor her blood pressure as well as her progress with the above lifestyle modifications. She agrees to continue Diovan HCT 160-12.5 mg daily #30 with no refills and will watch for signs of hypotension as she continues her lifestyle modifications.  Depression with Emotional Eating Behaviors We discussed behavior modification techniques today to help Cassidy Giles deal with her emotional eating and depression. Cassidy Giles will follow up as directed.  Diabetes II Cassidy Giles has been given extensive diabetes education by myself today including ideal fasting and post-prandial blood glucose readings, individual ideal Hgb A1c goals and hypoglycemia prevention. We discussed the importance of good blood sugar control to decrease the likelihood of diabetic complications such as nephropathy, neuropathy, limb loss, blindness, coronary artery disease, and death.  We discussed the importance of intensive lifestyle modification including diet, exercise and weight loss as the first line treatment for diabetes. [name] will work on  increasing lean protein and decreasing simple carbohydrates in her diet. She will follow up at the agreed upon time.  Obesity Cassidy Giles is currently in the action stage of change. As such, her goal is to continue with weight loss efforts She has agreed to follow the Category 3 plan Cassidy Giles has been instructed to work up to a goal of 150 minutes of combined cardio and strengthening exercise per week for weight loss and overall health benefits. We discussed the following Behavioral Modification Strategies today: planning for success, increase H2O intake, no skipping meals, keeping healthy foods in the home, increasing lean protein intake, decreasing simple carbohydrates, increasing vegetables, decrease eating out and work on meal planning and intentional eating Fill out forms for [employer] has agreed to follow up with our clinic in 2 weeks. She was informed of the importance of frequent follow up visits to maximize her success with intensive lifestyle modifications for her multiple health conditions.  ALLERGIES: Allergies  Allergen Reactions  . Other     Seasonal allergies    MEDICATIONS: Current Outpatient Medications on File Prior to Visit  Medication Sig Dispense Refill  . magnesium citrate SOLN As directed    . metFORMIN (GLUCOPHAGE-XR) 500 MG 24 hr tablet Take 1 tablet by mouth daily.    . montelukast (SINGULAIR) 10 MG tablet Take 10 mg by mouth at bedtime.      . Multiple Vitamin (MULTIVITAMIN) tablet Take 1 tablet by mouth daily.      . Vitamin D, Ergocalciferol, (DRISDOL) 1.25 MG (50000 UT) CAPS capsule Take 1 capsule (50,000 Units total) by mouth every 7 (seven) days. 4 capsule 0  . zolpidem (AMBIEN) 5 MG tablet Take 5 mg by mouth at bedtime as needed.       No current facility-administered medications on file prior to visit.     PAST MEDICAL HISTORY: Past Medical History:  Diagnosis Date  . Anemia   . Diabetes ([place])   . Fibroid   . Gallbladder problem    . High blood pressure   . High cholesterol   . Obesity     PAST SURGICAL HISTORY: Past Surgical History:  Procedure Laterality Date  . CHOLECYSTECTOMY    . HAND SURGERY     RIGHT  . INTRAUTERINE DEVICE INSERTION  01/06/2006   MIRENA  . KNEE SURGERY     RIGHT  . KNEE SURGERY  3/12017    SOCIAL HISTORY: Social History   Tobacco Use  . Smoking status: Never Smoker  . Smokeless tobacco: Never Used  Substance Use Topics  . Alcohol use: Yes    Comment: OCC  . Drug use: No    FAMILY HISTORY: Family History  Problem Relation Age of Onset  . Diabetes Mother   . Hypertension Mother   . Cancer Mother        UTERINE  . Heart disease Mother   . High Cholesterol Mother   . Sudden death Mother   . Stroke Mother   . Depression Mother   . Diabetes Sister   . Hypertension Sister   . Breast cancer Sister   . Diabetes Brother   . Cancer Brother        LUNG/LIVER...DECEASED 30-Jul-2009  . Hypertension Brother     ROS: Review of Systems  Constitutional: Negative for weight loss.  Respiratory: Negative  for shortness of breath (on exertion).   Cardiovascular: Negative for chest pain.  Endo/Heme/Allergies:       Negative for hypoglycemia  Psychiatric/Behavioral: Positive for depression. Negative for suicidal ideas.    PHYSICAL EXAM: Pt in no acute distress  RECENT LABS AND TESTS: BMET    Component Value Date/Time   NA 140 05/21/2018 0938   K 4.5 05/21/2018 0938   CL 98 05/21/2018 0938   CO2 23 05/21/2018 0938   GLUCOSE 141 (H) 05/21/2018 0938   GLUCOSE 144 (H) 09/06/2015 1004   BUN 9 05/21/2018 0938   CREATININE 0.80 05/21/2018 0938   CALCIUM 9.9 05/21/2018 0938   GFRNONAA 84 05/21/2018 0938   GFRAA 97 05/21/2018 0938   Lab Results  Component Value Date   HGBA1C 7.3 (H) 05/21/2018   HGBA1C 6.3 (H) 12/06/2011   Lab Results  Component Value Date   INSULIN 11.5 05/21/2018   CBC    Component Value Date/Time   WBC 5.8 05/21/2018 0938   WBC 7.5 09/06/2015 1004    RBC 4.85 05/21/2018 0938   RBC 4.84 09/06/2015 1004   HGB 12.8 05/21/2018 0938   HCT 38.6 05/21/2018 0938   PLT 260 09/06/2015 1004   MCV 80 05/21/2018 0938   MCH 26.4 (L) 05/21/2018 0938   MCH 26.4 09/06/2015 1004   MCHC 33.2 05/21/2018 0938   MCHC 32.5 09/06/2015 1004   RDW 13.4 05/21/2018 0938   LYMPHSABS 2.5 05/21/2018 0938   MONOABS 0.4 09/06/2015 1004   EOSABS 0.0 05/21/2018 0938   BASOSABS 0.0 05/21/2018 0938   Iron/TIBC/Ferritin/ %Sat No results found for: IRON, TIBC, FERRITIN, IRONPCTSAT Lipid Panel     Component Value Date/Time   CHOL 246 (H) 05/21/2018 0938   TRIG 88 05/21/2018 0938   HDL 83 05/21/2018 0938   LDLCALC 145 (H) 05/21/2018 0938   Hepatic Function Panel     Component Value Date/Time   PROT 7.8 05/21/2018 0938   ALBUMIN 4.6 05/21/2018 0938   AST 15 05/21/2018 0938   ALT 20 05/21/2018 0938   ALKPHOS 61 05/21/2018 0938   BILITOT 0.9 05/21/2018 0938      Component Value Date/Time   TSH 1.550 05/21/2018 0938   TSH 1.694 09/27/2014 1215     Ref. Range 05/21/2018 09:38  Vitamin D, 25-Hydroxy Latest Ref Range: 30.0 - 100.0 ng/mL 12.8 (L)    I, Doreene Nest, am acting as Location manager for [employer]. Owens Shark, DO  I have reviewed the above documentation for accuracy and completeness, and I agree with the above. -Jearld Lesch, DO

## 2018-12-11 ENCOUNTER — Encounter (INDEPENDENT_AMBULATORY_CARE_PROVIDER_SITE_OTHER): Payer: Self-pay | Admitting: Bariatrics

## 2018-12-22 ENCOUNTER — Telehealth (INDEPENDENT_AMBULATORY_CARE_PROVIDER_SITE_OTHER): Payer: BC Managed Care – PPO | Admitting: Bariatrics

## 2018-12-23 ENCOUNTER — Ambulatory Visit (INDEPENDENT_AMBULATORY_CARE_PROVIDER_SITE_OTHER): Payer: BC Managed Care – PPO | Admitting: Bariatrics

## 2018-12-23 ENCOUNTER — Other Ambulatory Visit: Payer: Self-pay

## 2018-12-23 ENCOUNTER — Encounter (INDEPENDENT_AMBULATORY_CARE_PROVIDER_SITE_OTHER): Payer: Self-pay | Admitting: Bariatrics

## 2018-12-23 VITALS — BP 152/82 | HR 75 | Temp 99.0°F | Ht 64.0 in | Wt 259.0 lb

## 2018-12-23 DIAGNOSIS — Z9189 Other specified personal risk factors, not elsewhere classified: Secondary | ICD-10-CM | POA: Diagnosis not present

## 2018-12-23 DIAGNOSIS — Z6841 Body Mass Index (BMI) 40.0 and over, adult: Secondary | ICD-10-CM

## 2018-12-23 DIAGNOSIS — I10 Essential (primary) hypertension: Secondary | ICD-10-CM | POA: Diagnosis not present

## 2018-12-23 DIAGNOSIS — E1169 Type 2 diabetes mellitus with other specified complication: Secondary | ICD-10-CM

## 2018-12-23 DIAGNOSIS — E785 Hyperlipidemia, unspecified: Secondary | ICD-10-CM

## 2018-12-23 DIAGNOSIS — F3289 Other specified depressive episodes: Secondary | ICD-10-CM

## 2018-12-23 MED ORDER — VALSARTAN-HYDROCHLOROTHIAZIDE 160-12.5 MG PO TABS: 1.0000 | ORAL_TABLET | Freq: Every day | ORAL | 0 refills | Status: DC

## 2018-12-28 ENCOUNTER — Encounter (INDEPENDENT_AMBULATORY_CARE_PROVIDER_SITE_OTHER): Payer: Self-pay | Admitting: Bariatrics

## 2018-12-28 NOTE — Progress Notes (Signed)
Office: [phone]  /  Fax: [phone]   HPI:   Chief Complaint: OBESITY Cassidy Giles is here to discuss her progress with her obesity treatment plan. Cassidy Giles is on the Category 2 plan and is following her eating plan approximately 80 % of the time. Cassidy Giles states Cassidy Giles is walking 15 minutes 3 times per week. Cassidy Giles has not been here since 07/01/2018. Cassidy Giles lost 2 pounds since her last visit. Cassidy Giles is doing better with the water. Her weight is 259 lb (117.5 kg) today and has had a weight loss of 2 pounds since her last in-office visit. Cassidy Giles has lost 5 lbs since starting treatment with [place].  Diabetes II Cassidy Giles has a diagnosis of diabetes type II. Cassidy Giles is not on medications. Cassidy Giles does not check fasting BGs on a regular basis. Last A1c was at 7.3 and last insulin level was at 11.5 Cassidy Giles has been working on intensive lifestyle modifications including diet, exercise, and weight loss to help control her blood glucose levels.  Hypertension Cassidy Giles is a 53 y.o. female with hypertension. Her blood Giles is slightly elevated. Cassidy Giles denies lightheadedness. Cassidy Giles is working weight loss to help control her blood Giles with the goal of decreasing her risk of heart attack and stroke. Cassidy Giles is currently controlled.  At risk for cardiovascular disease Cassidy Giles is at a higher than average risk for cardiovascular disease due to obesity, diabetes and hypertension. Cassidy Giles currently denies any chest pain.  Depression with emotional eating behaviors Cassidy Giles has seen Dr. Mallie Mussel. Cassidy Giles is not currently stress eating. Cassidy Giles struggles with emotional eating and using food for comfort to the extent that it is negatively impacting her health. Cassidy Giles often snacks when Cassidy Giles is not hungry. Cassidy Giles sometimes feels Cassidy Giles is out of control and then feels guilty that Cassidy Giles made poor food choices. Cassidy Giles has been working on behavior modification techniques to help reduce her emotional eating and has been somewhat successful. Cassidy Giles  shows no sign of suicidal or homicidal ideations.  ASSESSMENT AND PLAN:  Dyslipidemia associated with type 2 diabetes mellitus ([place])  Essential hypertension - Plan: valsartan-hydrochlorothiazide (DIOVAN HCT) 160-12.5 MG tablet  Other depression - with emotional eating   At risk for heart disease  Class 3 severe obesity with serious comorbidity and body mass index (BMI) of 40.0 to 44.9 in adult, unspecified obesity type ([place])  PLAN:  Diabetes II Cassidy Giles has been given extensive diabetes education by myself today including ideal fasting and post-prandial blood glucose readings, individual ideal Hgb A1c goals and hypoglycemia prevention. We discussed the importance of good blood sugar control to decrease the likelihood of diabetic complications such as nephropathy, neuropathy, limb loss, blindness, coronary artery disease, and death. We discussed the importance of intensive lifestyle modification including diet, exercise and weight loss as the first line treatment for diabetes. Cassidy Giles will continue to work on increasing lean protein and decreasing simple carbohydrates in her diet. Cassidy Giles agrees to follow up with our clinic in 3 to 4 weeks.  Hypertension We discussed sodium restriction, working on healthy weight loss, and a regular exercise program as the means to achieve improved blood Giles control. Cassidy Giles agreed with this plan and agreed to follow up as directed. We will continue to monitor her blood Giles as well as her progress with the above lifestyle modifications. Cassidy Giles agrees to continue Valsartan-HCTZ 160-12.5 mg daily #30 with no refills and will watch for signs of hypotension as Cassidy Giles continues her lifestyle modifications.  Cardiovascular risk counseling Cassidy Giles was given extended (  15 minutes) coronary artery disease prevention counseling today. Cassidy Giles is 53 y.o. female and has risk factors for heart disease including obesity, diabetes and hypertension. We discussed intensive lifestyle  modifications today with an emphasis on specific weight loss instructions and strategies. Pt was also informed of the importance of increasing exercise and decreasing saturated fats to help prevent heart disease.  Depression with Emotional Eating Behaviors We discussed behavior modification techniques today to help Cassidy Giles deal with her emotional eating and depression. Cassidy Giles agrees to follow up with our clinic in 3 to 4 weeks.  Obesity Cassidy Giles is currently in the action stage of change. As such, her goal is to continue with weight loss efforts Cassidy Giles has agreed to follow the Category 2 plan Cassidy Giles will continue walking for weight loss and overall health benefits. We discussed the following Behavioral Modification Strategies today: increase H2O intake, no skipping meals, keeping healthy foods in the home, increasing lean protein intake, decreasing simple carbohydrates, increasing vegetables, decrease eating out and work on meal planning and intentional eating Cassidy Giles will consider journaling.  Cassidy Giles has agreed to follow up with our clinic in 3 to 4 weeks fasting. Cassidy Giles was informed of the importance of frequent follow up visits to maximize her success with intensive lifestyle modifications for her multiple health conditions.  ALLERGIES: Allergies  Allergen Reactions   Other     Seasonal allergies    MEDICATIONS: Current Outpatient Medications on File Prior to Visit  Medication Sig Dispense Refill   magnesium citrate SOLN As directed     metFORMIN (GLUCOPHAGE-XR) 500 MG 24 hr tablet Take 1 tablet by mouth daily.     montelukast (SINGULAIR) 10 MG tablet Take 10 mg by mouth at bedtime.       Multiple Vitamin (MULTIVITAMIN) tablet Take 1 tablet by mouth daily.       Vitamin D, Ergocalciferol, (DRISDOL) 1.25 MG (50000 UT) CAPS capsule Take 1 capsule (50,000 Units total) by mouth every 7 (seven) days. 4 capsule 0   zolpidem (AMBIEN) 5 MG tablet Take 5 mg by mouth at bedtime as needed.        No current facility-administered medications on file prior to visit.     PAST MEDICAL HISTORY: Past Medical History:  Diagnosis Date   Anemia    Diabetes ([place])    Fibroid    Gallbladder problem    High blood Giles    High cholesterol    Obesity     PAST SURGICAL HISTORY: Past Surgical History:  Procedure Laterality Date   CHOLECYSTECTOMY     HAND SURGERY     RIGHT   INTRAUTERINE DEVICE INSERTION  01/06/2006   MIRENA   KNEE SURGERY     RIGHT   KNEE SURGERY  3/12017    SOCIAL HISTORY: Social History   Tobacco Use   Smoking status: Never Smoker   Smokeless tobacco: Never Used  Substance Use Topics   Alcohol use: Yes    Comment: OCC   Drug use: No    FAMILY HISTORY: Family History  Problem Relation Age of Onset   Diabetes Mother    Hypertension Mother    Cancer Mother        UTERINE   Heart disease Mother    High Cholesterol Mother    Sudden death Mother    Stroke Mother    Depression Mother    Diabetes Sister    Hypertension Sister    Breast cancer Sister    Diabetes Brother    Cancer  Brother        LUNG/LIVER...DECEASED 07/28/2009   Hypertension Brother     ROS: Review of Systems  Constitutional: Positive for weight loss.  Cardiovascular: Negative for chest pain.  Neurological:       Negative for lightheadedness  Psychiatric/Behavioral: Positive for depression. Negative for suicidal ideas.    PHYSICAL EXAM: Blood Giles (!) 152/82, pulse 75, temperature 99 F (37.2 C), temperature source Oral, height 5\' 4"  (1.626 m), weight 259 lb (117.5 kg), last menstrual period 03/21/2017, SpO2 100 %. Body mass index is 44.46 kg/m. Physical Exam Vitals signs reviewed.  Constitutional:      Appearance: Normal appearance. Cassidy Giles is well-developed. Cassidy Giles is obese.  Cardiovascular:     Rate and Rhythm: Normal rate.  Pulmonary:     Effort: Pulmonary effort is normal.  Musculoskeletal: Normal range of motion.  Skin:    General:  Skin is warm and dry.  Neurological:     Mental Status: Cassidy Giles is alert and oriented to person, place, and time.  Psychiatric:        Mood and Affect: Mood normal.        Behavior: Behavior normal.        Thought Content: Thought content does not include homicidal or suicidal ideation.     RECENT LABS AND TESTS: BMET    Component Value Date/Time   NA 140 05/21/2018 0938   K 4.5 05/21/2018 0938   CL 98 05/21/2018 0938   CO2 23 05/21/2018 0938   GLUCOSE 141 (H) 05/21/2018 0938   GLUCOSE 144 (H) 09/06/2015 1004   BUN 9 05/21/2018 0938   CREATININE 0.80 05/21/2018 0938   CALCIUM 9.9 05/21/2018 0938   GFRNONAA 84 05/21/2018 0938   GFRAA 97 05/21/2018 0938   Lab Results  Component Value Date   HGBA1C 7.3 (H) 05/21/2018   HGBA1C 6.3 (H) 12/06/2011   Lab Results  Component Value Date   INSULIN 11.5 05/21/2018   CBC    Component Value Date/Time   WBC 5.8 05/21/2018 0938   WBC 7.5 09/06/2015 1004   RBC 4.85 05/21/2018 0938   RBC 4.84 09/06/2015 1004   HGB 12.8 05/21/2018 0938   HCT 38.6 05/21/2018 0938   PLT 260 09/06/2015 1004   MCV 80 05/21/2018 0938   MCH 26.4 (L) 05/21/2018 0938   MCH 26.4 09/06/2015 1004   MCHC 33.2 05/21/2018 0938   MCHC 32.5 09/06/2015 1004   RDW 13.4 05/21/2018 0938   LYMPHSABS 2.5 05/21/2018 0938   MONOABS 0.4 09/06/2015 1004   EOSABS 0.0 05/21/2018 0938   BASOSABS 0.0 05/21/2018 0938   Iron/TIBC/Ferritin/ %Sat No results found for: IRON, TIBC, FERRITIN, IRONPCTSAT Lipid Panel     Component Value Date/Time   CHOL 246 (H) 05/21/2018 0938   TRIG 88 05/21/2018 0938   HDL 83 05/21/2018 0938   LDLCALC 145 (H) 05/21/2018 0938   Hepatic Function Panel     Component Value Date/Time   PROT 7.8 05/21/2018 0938   ALBUMIN 4.6 05/21/2018 0938   AST 15 05/21/2018 0938   ALT 20 05/21/2018 0938   ALKPHOS 61 05/21/2018 0938   BILITOT 0.9 05/21/2018 0938      Component Value Date/Time   TSH 1.550 05/21/2018 0938   TSH 1.694 09/27/2014 1215      Ref. Range 05/21/2018 09:38  Vitamin D, 25-Hydroxy Latest Ref Range: 30.0 - 100.0 ng/mL 12.8 (L)    OBESITY BEHAVIORAL INTERVENTION VISIT  Today's visit was # 5   Starting weight: 264  lbs Starting date: 05/21/2018 Today's weight : 259 lbs  Today's date: 12/23/2018 Total lbs lost to date: 5    12/23/2018  Height 5\' 4"  (1.626 m)  Weight 259 lb (117.5 kg)  BMI (Calculated) 44.44  BLOOD Giles - SYSTOLIC 0000000  BLOOD Giles - DIASTOLIC 82   Body Fat % 123456 %  Total Body Water (lbs) 88.4 lbs    ASK: We discussed the diagnosis of obesity with Cassidy Giles today and Roneka agreed to give [place] permission to discuss obesity behavioral modification therapy today.  ASSESS: Shawn has the diagnosis of obesity and her BMI today is 44.44 Luwanda is in the action stage of change   ADVISE: Crystalyn was educated on the multiple health risks of obesity as well as the benefit of weight loss to improve her health. Cassidy Giles was advised of the need for long term treatment and the importance of lifestyle modifications to improve her current health and to decrease her risk of future health problems.  AGREE: Multiple dietary modification options and treatment options were discussed and  Armella agreed to follow the recommendations documented in the above note.  ARRANGE: Shynice was educated on the importance of frequent visits to treat obesity as outlined per CMS and USPSTF guidelines and agreed to schedule her next follow up appointment today.  Corey Skains, am acting as Location manager for [employer]. Owens Shark, DO  I have reviewed the above documentation for accuracy and completeness, and I agree with the above. -Jearld Lesch, DO

## 2018-12-30 ENCOUNTER — Other Ambulatory Visit (INDEPENDENT_AMBULATORY_CARE_PROVIDER_SITE_OTHER): Payer: Self-pay | Admitting: Bariatrics

## 2018-12-30 DIAGNOSIS — I10 Essential (primary) hypertension: Secondary | ICD-10-CM

## 2019-01-14 ENCOUNTER — Ambulatory Visit (INDEPENDENT_AMBULATORY_CARE_PROVIDER_SITE_OTHER): Payer: BC Managed Care – PPO | Admitting: Bariatrics

## 2019-01-27 ENCOUNTER — Other Ambulatory Visit (INDEPENDENT_AMBULATORY_CARE_PROVIDER_SITE_OTHER): Payer: Self-pay | Admitting: Bariatrics

## 2019-01-27 DIAGNOSIS — I10 Essential (primary) hypertension: Secondary | ICD-10-CM

## 2019-01-28 ENCOUNTER — Other Ambulatory Visit: Payer: Self-pay

## 2019-01-28 ENCOUNTER — Ambulatory Visit (INDEPENDENT_AMBULATORY_CARE_PROVIDER_SITE_OTHER): Payer: BC Managed Care – PPO | Admitting: Bariatrics

## 2019-01-28 ENCOUNTER — Encounter (INDEPENDENT_AMBULATORY_CARE_PROVIDER_SITE_OTHER): Payer: Self-pay | Admitting: Bariatrics

## 2019-01-28 VITALS — BP 120/75 | HR 91 | Temp 98.4°F | Ht 64.0 in | Wt 259.0 lb

## 2019-01-28 DIAGNOSIS — Z6841 Body Mass Index (BMI) 40.0 and over, adult: Secondary | ICD-10-CM

## 2019-01-28 DIAGNOSIS — E119 Type 2 diabetes mellitus without complications: Secondary | ICD-10-CM

## 2019-01-28 DIAGNOSIS — E1169 Type 2 diabetes mellitus with other specified complication: Secondary | ICD-10-CM | POA: Diagnosis not present

## 2019-01-28 DIAGNOSIS — Z9189 Other specified personal risk factors, not elsewhere classified: Secondary | ICD-10-CM | POA: Diagnosis not present

## 2019-01-28 DIAGNOSIS — I10 Essential (primary) hypertension: Secondary | ICD-10-CM | POA: Diagnosis not present

## 2019-01-28 DIAGNOSIS — E785 Hyperlipidemia, unspecified: Secondary | ICD-10-CM

## 2019-01-28 MED ORDER — VALSARTAN-HYDROCHLOROTHIAZIDE 160-12.5 MG PO TABS: 1.0000 | ORAL_TABLET | Freq: Every day | ORAL | 0 refills | Status: DC

## 2019-01-28 NOTE — Progress Notes (Signed)
Office: [phone]  /  Fax: [phone]   HPI:   Chief Complaint: OBESITY Cassidy Giles is here to discuss her progress with her obesity treatment plan. She is on the Category 2 plan and is following her eating plan approximately 2% of the time. She states she is exercising 0 minutes 0 times per week. Cassidy Giles's weight has stayed the same. She reports doing well with her protein intake. Her weight is 259 lb (117.5 kg) today and has not lost weight since her last visit. She has lost 5 lbs since starting treatment with Cassidy Giles.  Hypertension Pamelyn Gittleman is a 53 y.o. female with hypertension.  Brittnae Grewell denies headache. She is working weight loss to help control her blood pressure with the goal of decreasing her risk of heart attack and stroke. [name]'s blood pressure is 120/75, well controlled.  At risk for cardiovascular disease [name] is at a higher than average risk for cardiovascular disease due to obesity. She currently denies any chest pain.  Diabetes II [name] has a diagnosis of diabetes type II. [name] does not check her blood sugars. Last A1c was 7.3 on 05/21/2018 with an insulin of 11.5. She has been working on intensive lifestyle modifications including diet, exercise, and weight loss to help control her blood glucose levels.  Dyslipidemia with type II diabetes [name] has a diagnosis of dyslipidemia with type II diabetes. She denies myalgias.  ASSESSMENT AND PLAN:  Essential hypertension - Plan: valsartan-hydrochlorothiazide (DIOVAN HCT) 160-12.5 MG tablet  Type 2 diabetes mellitus without complication, without long-term current use of insulin ([facility]) - Plan: Comprehensive metabolic panel, Hemoglobin A1c, Insulin, random, VITAMIN D 25 Hydroxy (Vit-D Deficiency, Fractures)  Dyslipidemia associated with type 2 diabetes mellitus (Cassidy Giles) - Plan: Lipid Panel With LDL/HDL Ratio  At risk for heart disease  Class 3 severe obesity with serious comorbidity and body mass index (BMI) of  40.0 to 44.9 in adult, unspecified obesity type (Cassidy Giles)  PLAN:  Hypertension We discussed sodium restriction, working on healthy weight loss, and a regular exercise program as the means to achieve improved blood pressure control. [name] agreed with this plan and agreed to follow up as directed. We will continue to monitor her blood pressure as well as her progress with the above lifestyle modifications. [name] was given a prescription for Valsartan/HCTZ 160-12.5 mg QD #30 with 0 refills and agrees to follow-up with our clinic in 2 weeks. She will watch for signs of hypotension as she continues her lifestyle modifications.  Cardiovascular risk counseling Cassidy Giles was given extended (15 minutes) coronary artery disease prevention counseling today. She is 53 y.o. female and has risk factors for heart disease including obesity. We discussed intensive lifestyle modifications today with an emphasis on specific weight loss instructions and strategies. Pt was also informed of the importance of increasing exercise and decreasing saturated fats to help prevent heart disease.  Diabetes II Cassidy Giles has been given extensive diabetes education by myself today including ideal fasting and post-prandial blood glucose readings, individual ideal HgA1c goals  and hypoglycemia prevention. We discussed the importance of good blood sugar control to decrease the likelihood of diabetic complications such as nephropathy, neuropathy, limb loss, blindness, coronary artery disease, and death. We discussed the importance of intensive lifestyle modification including diet, exercise and weight loss as the first line treatment for diabetes. Cassidy Giles agrees to have A1c and insulin levels checked and will follow-up as directed.  Dyslipidemia with type II diabetes Cassidy Giles will have lipids checked and follow-up with Cassidy Giles as directed.  Obesity  Cassidy Giles is currently in the action stage of change. As such, her goal is to continue with weight loss  efforts. She has agreed to follow the Category 2 plan with additional breakfast options. Cassidy Giles will work on meal planning, intentional eating, and increasing her water intake. Cassidy Giles has been instructed to increase exercise (dance, U-tube videos, and walking) for weight loss and overall health benefits. We discussed the following Behavioral Modification Strategies today: increasing lean protein intake, decreasing simple carbohydrates, increasing vegetables, increase H20 intake, decrease eating out, no skipping meals, work on meal planning and easy cooking plans, keeping healthy foods in the home, and planning for success.  Cassidy Giles has agreed to follow-up with our clinic in 2 weeks. She was informed of the importance of frequent follow-up visits to maximize her success with intensive lifestyle modifications for her multiple health conditions.  ALLERGIES: Allergies  Allergen Reactions  . Other     Seasonal allergies    MEDICATIONS: Current Outpatient Medications on File Prior to Visit  Medication Sig Dispense Refill  . magnesium citrate SOLN As directed    . metFORMIN (GLUCOPHAGE-XR) 500 MG 24 hr tablet Take 1 tablet by mouth daily.    . montelukast (SINGULAIR) 10 MG tablet Take 10 mg by mouth at bedtime.      . Multiple Vitamin (MULTIVITAMIN) tablet Take 1 tablet by mouth daily.      . Vitamin D, Ergocalciferol, (DRISDOL) 1.25 MG (50000 UT) CAPS capsule Take 1 capsule (50,000 Units total) by mouth every 7 (seven) days. 4 capsule 0  . zolpidem (AMBIEN) 5 MG tablet Take 5 mg by mouth at bedtime as needed.       No current facility-administered medications on file prior to visit.     PAST MEDICAL HISTORY: Past Medical History:  Diagnosis Date  . Anemia   . Diabetes ([place])   . Fibroid   . Gallbladder problem   . High blood pressure   . High cholesterol   . Obesity     PAST SURGICAL HISTORY: Past Surgical History:  Procedure Laterality Date  . CHOLECYSTECTOMY    . HAND SURGERY      RIGHT  . INTRAUTERINE DEVICE INSERTION  01/06/2006   MIRENA  . KNEE SURGERY     RIGHT  . KNEE SURGERY  3/12017    SOCIAL HISTORY: Social History   Tobacco Use  . Smoking status: Never Smoker  . Smokeless tobacco: Never Used  Substance Use Topics  . Alcohol use: Yes    Comment: OCC  . Drug use: No    FAMILY HISTORY: Family History  Problem Relation Age of Onset  . Diabetes Mother   . Hypertension Mother   . Cancer Mother        UTERINE  . Heart disease Mother   . High Cholesterol Mother   . Sudden death Mother   . Stroke Mother   . Depression Mother   . Diabetes Sister   . Hypertension Sister   . Breast cancer Sister   . Diabetes Brother   . Cancer Brother        LUNG/LIVER...DECEASED 08-10-2009  . Hypertension Brother    ROS: Review of Systems  Musculoskeletal: Negative for myalgias.  Neurological: Negative for headaches.   PHYSICAL EXAM: Blood pressure 120/75, pulse 91, temperature 98.4 F (36.9 C), temperature source Oral, height 5\' 4"  (1.626 m), weight 259 lb (117.5 kg), last menstrual period 03/21/2017, SpO2 100 %. Body mass index is 44.46 kg/m. Physical Exam Vitals signs reviewed.  Constitutional:      Appearance: Normal appearance. She is obese.  Cardiovascular:     Rate and Rhythm: Normal rate.     Pulses: Normal pulses.  Pulmonary:     Effort: Pulmonary effort is normal.     Breath sounds: Normal breath sounds.  Musculoskeletal: Normal range of motion.  Skin:    General: Skin is warm and dry.  Neurological:     Mental Status: She is alert and oriented to person, place, and time.  Psychiatric:        Behavior: Behavior normal.   RECENT LABS AND TESTS: BMET    Component Value Date/Time   NA 140 05/21/2018 0938   K 4.5 05/21/2018 0938   CL 98 05/21/2018 0938   CO2 23 05/21/2018 0938   GLUCOSE 141 (H) 05/21/2018 0938   GLUCOSE 144 (H) 09/06/2015 1004   BUN 9 05/21/2018 0938   CREATININE 0.80 05/21/2018 0938   CALCIUM 9.9 05/21/2018 0938    GFRNONAA 84 05/21/2018 0938   GFRAA 97 05/21/2018 0938   Lab Results  Component Value Date   HGBA1C 7.3 (H) 05/21/2018   HGBA1C 6.3 (H) 12/06/2011   Lab Results  Component Value Date   INSULIN 11.5 05/21/2018   CBC    Component Value Date/Time   WBC 5.8 05/21/2018 0938   WBC 7.5 09/06/2015 1004   RBC 4.85 05/21/2018 0938   RBC 4.84 09/06/2015 1004   HGB 12.8 05/21/2018 0938   HCT 38.6 05/21/2018 0938   PLT 260 09/06/2015 1004   MCV 80 05/21/2018 0938   MCH 26.4 (L) 05/21/2018 0938   MCH 26.4 09/06/2015 1004   MCHC 33.2 05/21/2018 0938   MCHC 32.5 09/06/2015 1004   RDW 13.4 05/21/2018 0938   LYMPHSABS 2.5 05/21/2018 0938   MONOABS 0.4 09/06/2015 1004   EOSABS 0.0 05/21/2018 0938   BASOSABS 0.0 05/21/2018 0938   Iron/TIBC/Ferritin/ %Sat No results found for: IRON, TIBC, FERRITIN, IRONPCTSAT Lipid Panel     Component Value Date/Time   CHOL 246 (H) 05/21/2018 0938   TRIG 88 05/21/2018 0938   HDL 83 05/21/2018 0938   LDLCALC 145 (H) 05/21/2018 0938   Hepatic Function Panel     Component Value Date/Time   PROT 7.8 05/21/2018 0938   ALBUMIN 4.6 05/21/2018 0938   AST 15 05/21/2018 0938   ALT 20 05/21/2018 0938   ALKPHOS 61 05/21/2018 0938   BILITOT 0.9 05/21/2018 0938      Component Value Date/Time   TSH 1.550 05/21/2018 0938   TSH 1.694 09/27/2014 1215   Results for SHERLEEN, PANCIERA (MRN [id number]) as of 01/28/2019 10:18  Ref. Range 05/21/2018 09:38  Vitamin D, 25-Hydroxy Latest Ref Range: 30.0 - 100.0 ng/mL 12.8 (L)   OBESITY BEHAVIORAL INTERVENTION VISIT  Today's visit was #6  Starting weight: 264 lbs Starting date: 05/21/2018 Today's weight: 259 lbs  Today's date: 01/28/2019 Total lbs lost to date: 5    01/28/2019  Height 5\' 4"  (1.626 m)  Weight 259 lb (117.5 kg)  BMI (Calculated) 44.44  BLOOD PRESSURE - SYSTOLIC 123456  BLOOD PRESSURE - DIASTOLIC 75   Body Fat % 123XX123 %  Total Body Water (lbs) 85.2 lbs   ASK: We discussed the diagnosis of  obesity with Harlene Ramus today and Jennesis agreed to give Cassidy Giles permission to discuss obesity behavioral modification therapy today.  ASSESS: Buff has the diagnosis of obesity and her BMI today is 44.6. Shanikia is in the action stage of change.  ADVISE:  Justus was educated on the multiple health risks of obesity as well as the benefit of weight loss to improve her health. She was advised of the need for long term treatment and the importance of lifestyle modifications to improve her current health and to decrease her risk of future health problems.  AGREE: Multiple dietary modification options and treatment options were discussed and  [place] agreed to follow the recommendations documented in the above note.  ARRANGE: Chika was educated on the importance of frequent visits to treat obesity as outlined per CMS and USPSTF guidelines and agreed to schedule her next follow up appointment today.  Migdalia Dk, am acting as Location manager for [employer], DO  I have reviewed the above documentation for accuracy and completeness, and I agree with the above. -Jearld Lesch, DO

## 2019-01-29 LAB — COMPREHENSIVE METABOLIC PANEL
ALT: 15 IU/L (ref 0–32)
AST: 12 IU/L (ref 0–40)
Albumin/Globulin Ratio: 1.6 (ref 1.2–2.2)
Albumin: 4.5 g/dL (ref 3.8–4.9)
Alkaline Phosphatase: 54 IU/L (ref 39–117)
BUN/Creatinine Ratio: 15 (ref 9–23)
BUN: 12 mg/dL (ref 6–24)
Bilirubin Total: 0.5 mg/dL (ref 0.0–1.2)
CO2: 26 mmol/L (ref 20–29)
Calcium: 9.3 mg/dL (ref 8.7–10.2)
Chloride: 102 mmol/L (ref 96–106)
Creatinine, Ser: 0.78 mg/dL (ref 0.57–1.00)
GFR calc Af Amer: 99 mL/min/{1.73_m2} (ref >59)
GFR calc non Af Amer: 86 mL/min/{1.73_m2} (ref >59)
Globulin, Total: 2.8 g/dL (ref 1.5–4.5)
Glucose: 148 mg/dL — ABNORMAL HIGH (ref 65–99)
Potassium: 4.2 mmol/L (ref 3.5–5.2)
Sodium: 139 mmol/L (ref 134–144)
Total Protein: 7.3 g/dL (ref 6.0–8.5)

## 2019-01-29 LAB — LIPID PANEL WITH LDL/HDL RATIO
Cholesterol, Total: 231 mg/dL — ABNORMAL HIGH (ref 100–199)
HDL: 77 mg/dL (ref >39)
LDL Chol Calc (NIH): 143 mg/dL — ABNORMAL HIGH (ref 0–99)
LDL/HDL Ratio: 1.9 ratio (ref 0.0–3.2)
Triglycerides: 67 mg/dL (ref 0–149)
VLDL Cholesterol Cal: 11 mg/dL (ref 5–40)

## 2019-01-29 LAB — HEMOGLOBIN A1C
Est. average glucose Bld gHb Est-mCnc: 163 mg/dL
Hgb A1c MFr Bld: 7.3 % — ABNORMAL HIGH (ref 4.8–5.6)

## 2019-01-29 LAB — VITAMIN D 25 HYDROXY (VIT D DEFICIENCY, FRACTURES): Vit D, 25-Hydroxy: 32.9 ng/mL (ref 30.0–100.0)

## 2019-01-29 LAB — INSULIN, RANDOM: INSULIN: 12.7 u[IU]/mL (ref 2.6–24.9)

## 2019-02-10 ENCOUNTER — Ambulatory Visit (INDEPENDENT_AMBULATORY_CARE_PROVIDER_SITE_OTHER): Payer: BC Managed Care – PPO | Admitting: Bariatrics

## 2019-02-24 ENCOUNTER — Other Ambulatory Visit (INDEPENDENT_AMBULATORY_CARE_PROVIDER_SITE_OTHER): Payer: Self-pay | Admitting: Bariatrics

## 2019-02-24 DIAGNOSIS — I10 Essential (primary) hypertension: Secondary | ICD-10-CM

## 2019-04-10 ENCOUNTER — Emergency Department (HOSPITAL_BASED_OUTPATIENT_CLINIC_OR_DEPARTMENT_OTHER)
Admission: EM | Admit: 2019-04-10 | Discharge: 2019-04-10 | Disposition: A | Discharge status: Home / Self Care | Payer: BC Managed Care – PPO | Attending: Emergency Medicine | Admitting: Emergency Medicine

## 2019-04-10 ENCOUNTER — Emergency Department (HOSPITAL_BASED_OUTPATIENT_CLINIC_OR_DEPARTMENT_OTHER): Payer: BC Managed Care – PPO

## 2019-04-10 ENCOUNTER — Encounter (HOSPITAL_BASED_OUTPATIENT_CLINIC_OR_DEPARTMENT_OTHER): Payer: Self-pay | Admitting: *Deleted

## 2019-04-10 ENCOUNTER — Other Ambulatory Visit: Payer: Self-pay

## 2019-04-10 DIAGNOSIS — K529 Noninfective gastroenteritis and colitis, unspecified: Secondary | ICD-10-CM | POA: Diagnosis not present

## 2019-04-10 DIAGNOSIS — E782 Mixed hyperlipidemia: Secondary | ICD-10-CM | POA: Insufficient documentation

## 2019-04-10 DIAGNOSIS — I10 Essential (primary) hypertension: Secondary | ICD-10-CM | POA: Diagnosis not present

## 2019-04-10 DIAGNOSIS — Z79899 Other long term (current) drug therapy: Secondary | ICD-10-CM | POA: Insufficient documentation

## 2019-04-10 DIAGNOSIS — R1031 Right lower quadrant pain: Secondary | ICD-10-CM | POA: Diagnosis present

## 2019-04-10 DIAGNOSIS — Z7984 Long term (current) use of oral hypoglycemic drugs: Secondary | ICD-10-CM | POA: Insufficient documentation

## 2019-04-10 DIAGNOSIS — E119 Type 2 diabetes mellitus without complications: Secondary | ICD-10-CM | POA: Insufficient documentation

## 2019-04-10 LAB — CBC
HCT: 39.8 % (ref 36.0–46.0)
Hemoglobin: 12.7 g/dL (ref 12.0–15.0)
MCH: 26.3 pg (ref 26.0–34.0)
MCHC: 31.9 g/dL (ref 30.0–36.0)
MCV: 82.4 fL (ref 80.0–100.0)
Platelets: 258 10*3/uL (ref 150–400)
RBC: 4.83 MIL/uL (ref 3.87–5.11)
RDW: 13.2 % (ref 11.5–15.5)
WBC: 11.5 10*3/uL — ABNORMAL HIGH (ref 4.0–10.5)
nRBC: 0 % (ref 0.0–0.2)

## 2019-04-10 LAB — COMPREHENSIVE METABOLIC PANEL
ALT: 17 U/L (ref 0–44)
AST: 14 U/L — ABNORMAL LOW (ref 15–41)
Albumin: 4.4 g/dL (ref 3.5–5.0)
Alkaline Phosphatase: 57 U/L (ref 38–126)
Anion gap: 10 (ref 5–15)
BUN: 14 mg/dL (ref 6–20)
CO2: 21 mmol/L — ABNORMAL LOW (ref 22–32)
Calcium: 9.2 mg/dL (ref 8.9–10.3)
Chloride: 102 mmol/L (ref 98–111)
Creatinine, Ser: 0.83 mg/dL (ref 0.44–1.00)
GFR calc Af Amer: >60 mL/min (ref >60)
GFR calc non Af Amer: >60 mL/min (ref >60)
Glucose, Bld: 148 mg/dL — ABNORMAL HIGH (ref 70–99)
Potassium: 3.6 mmol/L (ref 3.5–5.1)
Sodium: 133 mmol/L — ABNORMAL LOW (ref 135–145)
Total Bilirubin: 1.1 mg/dL (ref 0.3–1.2)
Total Protein: 8.4 g/dL — ABNORMAL HIGH (ref 6.5–8.1)

## 2019-04-10 LAB — URINALYSIS, ROUTINE W REFLEX MICROSCOPIC
Bilirubin Urine: NEGATIVE
Glucose, UA: NEGATIVE mg/dL
Ketones, ur: 15 mg/dL — AB
Leukocytes,Ua: NEGATIVE
Nitrite: NEGATIVE
Protein, ur: NEGATIVE mg/dL
Specific Gravity, Urine: 1.02 (ref 1.005–1.030)
pH: 6 (ref 5.0–8.0)

## 2019-04-10 LAB — URINALYSIS, MICROSCOPIC (REFLEX): WBC, UA: NONE SEEN WBC/hpf (ref 0–5)

## 2019-04-10 LAB — LIPASE, BLOOD: Lipase: 42 U/L (ref 11–51)

## 2019-04-10 MED ORDER — AMOXICILLIN-POT CLAVULANATE 875-125 MG PO TABS
1.0000 | ORAL_TABLET | Freq: Once | ORAL | Status: AC
  Administered 2019-04-10: 22:00:00 1 via ORAL
  Filled 2019-04-10: qty 1

## 2019-04-10 MED ORDER — AMOXICILLIN-POT CLAVULANATE 875-125 MG PO TABS: 1.0000 | ORAL_TABLET | Freq: Three times a day (TID) | ORAL | 0 refills | Status: AC

## 2019-04-10 MED ORDER — IOHEXOL 300 MG/ML  SOLN
100.0000 mL | Freq: Once | INTRAMUSCULAR | Status: AC | PRN
  Administered 2019-04-10: 100 mL via INTRAVENOUS

## 2019-04-10 MED ORDER — KETOROLAC TROMETHAMINE 15 MG/ML IJ SOLN
15.0000 mg | Freq: Once | INTRAMUSCULAR | Status: AC
  Administered 2019-04-10: 15 mg via INTRAVENOUS
  Filled 2019-04-10: qty 1

## 2019-04-10 NOTE — ED Notes (Signed)
Patient transported to CT 

## 2019-04-10 NOTE — ED Provider Notes (Signed)
[place] EMERGENCY DEPARTMENT Provider Note   CSN: [id number] Arrival date & time: 04/10/19  1832     History Chief Complaint  Patient presents with  . Abdominal Pain    Cassidy Giles is a 53 y.o. female.  Patient is a 53 year old female with past medical history of diabetes, high blood pressure, hypercholesterolemia, obesity presenting to the emergency department for abdominal pain.  Reports that she has had lower quadrant bilateral abdominal pain for the past couple of days.  She describes it as crampy and is worse with eating.  Reports that today she had 2 episodes of bloody stools.  Reports about a teaspoonful of bright red blood per rectum.  Reports colonoscopy about 4 years ago which shows polyps.  No history of diverticulitis.  Denies any fever, chills, nausea, vomiting, chest pain, shortness of breath, dysuria, vaginal discharge or vaginal bleeding.        Past Medical History:  Diagnosis Date  . Anemia   . Diabetes ([place])   . Fibroid   . Gallbladder problem   . High blood pressure   . High cholesterol   . Obesity     Patient Active Problem List   Diagnosis Date Noted  . Vitamin D deficiency 07/06/2018  . Class 3 severe obesity with serious comorbidity and body mass index (BMI) of 40.0 to 44.9 in adult ([place]) 06/17/2018  . Other hyperlipidemia 05/26/2018  . Morbid obesity ([place]) 05/02/2016  . Family history of breast cancer in female 04/08/2011  . DM type 2 with diabetic dyslipidemia ([place]) 04/08/2011  . 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 Hypertension 11/27/2010    Past Surgical History:  Procedure Laterality Date  . CHOLECYSTECTOMY    . HAND SURGERY     RIGHT  . INTRAUTERINE DEVICE INSERTION  01/06/2006   MIRENA  . KNEE SURGERY     RIGHT  . KNEE SURGERY  3/12017     OB History    Gravida  0   Para  0   Term  0   Preterm  0   AB  0   Living  0     SAB  0   TAB  0   Ectopic  0   Multiple  0   Live Births  0            Family History  Problem Relation Age of Onset  . Diabetes Mother   . Hypertension Mother   . Cancer Mother        UTERINE  . Heart disease Mother   . High Cholesterol Mother   . Sudden death Mother   . Stroke Mother   . Depression Mother   . Diabetes Sister   . Hypertension Sister   . Breast cancer Sister   . Diabetes Brother   . Cancer Brother        LUNG/LIVER...DECEASED 17-Aug-2009  . Hypertension Brother     Social History   Tobacco Use  . Smoking status: Never Smoker  . Smokeless tobacco: Never Used  Substance Use Topics  . Alcohol use: Yes    Comment: OCC  . Drug use: No    Home Medications Prior to Admission medications   Medication Sig Start Date End Date Taking? Authorizing Provider  acetaminophen (TYLENOL) 500 MG tablet Take 1,000 mg by mouth every 6 (six) hours as needed for mild pain.   Yes [provider]  valsartan-hydrochlorothiazide (DIOVAN HCT) 160-12.5 MG tablet Take 1 tablet by mouth daily. 01/28/19  Yes Jearld Lesch A, DO  amoxicillin-clavulanate (AUGMENTIN) 875-125 MG tablet Take 1 tablet by mouth 3 (three) times daily for 10 days. 04/10/19 04/20/19  Madilyn Hook A, PA-C  magnesium citrate SOLN As directed    [provider]  metFORMIN (GLUCOPHAGE-XR) 500 MG 24 hr tablet Take 1 tablet by mouth daily. 04/05/15   [provider]  montelukast (SINGULAIR) 10 MG tablet Take 10 mg by mouth at bedtime.      [provider]  Multiple Vitamin (MULTIVITAMIN) tablet Take 1 tablet by mouth daily.      [provider]  Vitamin D, Ergocalciferol, (DRISDOL) 1.25 MG (50000 UT) CAPS capsule Take 1 capsule (50,000 Units total) by mouth every 7 (seven) days. 07/01/18   Jearld Lesch A, DO  zolpidem (AMBIEN) 5 MG tablet Take 5 mg by mouth at bedtime as needed.      [provider]    Allergies    Other  Review of Systems   Review of Systems  Constitutional: Positive for appetite change. Negative for activity change,  chills and fever.  HENT: Negative for congestion and sore throat.   Respiratory: Negative for cough and shortness of breath.   Cardiovascular: Negative for chest pain and leg swelling.  Gastrointestinal: Positive for abdominal pain and blood in stool. Negative for abdominal distention, anal bleeding, constipation, diarrhea, nausea, rectal pain and vomiting.  Genitourinary: Negative for decreased urine volume, dysuria, vaginal bleeding, vaginal discharge and vaginal pain.  Musculoskeletal: Negative for arthralgias.  Skin: Negative for rash.  Neurological: Negative for dizziness, light-headedness, numbness and headaches.    Physical Exam Updated Vital Signs BP (!) 157/80 (BP Location: Right Arm)   Pulse 89   Temp 99.1 F (37.3 C) (Oral)   Resp 20   Ht 5' 4.5" (1.638 m)   Wt 116.6 kg   LMP 03/21/2017 (LMP Unknown)   SpO2 100%   BMI 43.43 kg/m   Physical Exam Vitals and nursing note reviewed.  Constitutional:      Appearance: Normal appearance.  HENT:     Head: Normocephalic.     Mouth/Throat:     Mouth: Mucous membranes are moist.  Eyes:     Conjunctiva/sclera: Conjunctivae normal.  Cardiovascular:     Rate and Rhythm: Normal rate and regular rhythm.  Pulmonary:     Effort: Pulmonary effort is normal.  Abdominal:     General: Bowel sounds are normal.     Tenderness: There is no abdominal tenderness. There is no guarding or rebound. Negative signs include Murphy's sign, Rovsing's sign and psoas sign.     Hernia: No hernia is present.  Skin:    General: Skin is dry.  Neurological:     Mental Status: She is alert.  Psychiatric:        Mood and Affect: Mood normal.     ED Results / Procedures / Treatments   Labs (all labs ordered are listed, but only abnormal results are displayed) Labs Reviewed  COMPREHENSIVE METABOLIC PANEL - Abnormal; Notable for the following components:      Result Value   Sodium 133 (*)    CO2 21 (*)    Glucose, Bld 148 (*)    Total Protein  8.4 (*)    AST 14 (*)    All other components within normal limits  CBC - Abnormal; Notable for the following components:   WBC 11.5 (*)    All other components within normal limits  URINALYSIS, ROUTINE W REFLEX MICROSCOPIC - Abnormal;  Notable for the following components:   Hgb urine dipstick MODERATE (*)    Ketones, ur 15 (*)    All other components within normal limits  URINALYSIS, MICROSCOPIC (REFLEX) - Abnormal; Notable for the following components:   Bacteria, UA RARE (*)    All other components within normal limits  LIPASE, BLOOD    EKG None  Radiology CT ABDOMEN PELVIS W CONTRAST  Result Date: 04/10/2019 CLINICAL DATA:  Lower abdominal pressure since Thursday. Recent bloody bowel movement. History of prior cholecystectomy. EXAM: CT ABDOMEN AND PELVIS WITH CONTRAST TECHNIQUE: Multidetector CT imaging of the abdomen and pelvis was performed using the standard protocol following bolus administration of intravenous contrast. CONTRAST:  161mL OMNIPAQUE IOHEXOL 300 MG/ML  SOLN COMPARISON:  None. FINDINGS: Lower chest: The lung bases are clear. The heart size is normal. Hepatobiliary: There is decreased hepatic attenuation suggestive of hepatic steatosis. Status post cholecystectomy.There is no biliary ductal dilation. Pancreas: Normal contours without ductal dilatation. No peripancreatic fluid collection. Spleen: No splenic laceration or hematoma. Adrenals/Urinary Tract: --Adrenal glands: No adrenal hemorrhage. --Right kidney/ureter: The right kidney is located in the pelvis. There is a nonobstructing stone in the right kidney measuring approximately 6 mm. --Left kidney/ureter: No hydronephrosis or perinephric hematoma. --Urinary bladder: Unremarkable. Stomach/Bowel: --Stomach/Duodenum: No hiatal hernia or other gastric abnormality. Normal duodenal course and caliber. --Small bowel: No dilatation or inflammation. --Colon: There is diffuse wall thickening of the sigmoid colon with adjacent  inflammatory changes. There is no drainable fluid collection or extraluminal gas. There are few enlarged regional lymph nodes measuring up to approximately 8 mm in the short axis. There are scattered colonic diverticula. --Appendix: Normal. Vascular/Lymphatic: Normal course and caliber of the major abdominal vessels. --No retroperitoneal lymphadenopathy. --No mesenteric lymphadenopathy. --No pelvic or inguinal lymphadenopathy. Reproductive: Unremarkable Other: No ascites or free air. The abdominal wall is normal. Musculoskeletal. No acute displaced fractures. IMPRESSION: 1. Diffuse wall thickening of the sigmoid colon with adjacent inflammatory changes, consistent with acute diverticulitis. No drainable fluid collection or extraluminal gas. As an underlying mass cannot be excluded on this exam, follow-up with a nonemergent outpatient colonoscopy is recommended. 2. Right pelvic kidney. 3. Nonobstructive right nephrolithiasis. 4. Hepatic steatosis. Electronically Signed   By: Constance Holster M.D.   On: 04/10/2019 21:14    Procedures Procedures (including critical care time)  Medications Ordered in ED Medications  ketorolac (TORADOL) 15 MG/ML injection 15 mg (15 mg Intravenous Given 04/10/19 2034)  iohexol (OMNIPAQUE) 300 MG/ML solution 100 mL (100 mLs Intravenous Contrast Given 04/10/19 2055)  amoxicillin-clavulanate (AUGMENTIN) 875-125 MG per tablet 1 tablet (1 tablet Oral Given 04/10/19 2136)    ED Course  I have reviewed the triage vital signs and the nursing notes.  Pertinent labs & imaging results that were available during my care of the patient were reviewed by me and considered in my medical decision making (see chart for details).  Clinical Course as of Apr 10 2135  Sat Apr 10, 2019  2126 Patient with crampy lower abdominal pain and bloody stools.  Work-up revealing a mildly elevated white blood cell count to 11.5.  Mildly hyponatremic to 133.  CT scan showing diverticulitis without  abscess or perforation.  No history of the same in the past.  Patient is otherwise afebrile with reassuring vital signs and appears well on my exam.  Will treat patient outpatient with p.o. oral antibiotics and follow-up with her primary care doctor in 2 days.  Advised on strict return precautions.   [KM]  Clinical Course User Index [KM] Kristine Royal   MDM Rules/Calculators/A&P     CHA2DS2/VAS Stroke Risk Points      N/A >= 2 Points: High Risk  1 - 1.99 Points: Medium Risk  0 Points: Low Risk    A final score could not be computed because of missing components.: Last  Change: N/A     This score determines the patient's risk of having a stroke if the  patient has atrial fibrillation.      This score is not applicable to this patient. Components are not  calculated.                   Based on review of vitals, medical screening exam, lab work and/or imaging, there does not appear to be an acute, emergent etiology for the patient's symptoms. Counseled pt on good return precautions and encouraged both PCP and ED follow-up as needed.  Prior to discharge, I also discussed incidental imaging findings with patient in detail and advised appropriate, recommended follow-up in detail.  Clinical Impression: 1. Colitis     Disposition: Discharge  Prior to providing a prescription for a controlled substance, I independently reviewed the patient's recent prescription history on the [facility]. The patient had no recent or regular prescriptions and was deemed appropriate for a brief, less than 3 day prescription of narcotic for acute analgesia.  This note was prepared with assistance of Systems analyst. Occasional wrong-word or sound-a-like substitutions may have occurred due to the inherent limitations of voice recognition software.  Final Clinical Impression(s) / ED Diagnoses Final diagnoses:  Colitis    Rx / DC  Orders ED Discharge Orders         Ordered    amoxicillin-clavulanate (AUGMENTIN) 875-125 MG tablet  3 times daily     04/10/19 2131           Kristine Royal 04/10/19 2136    Noemi Chapel, MD 04/10/19 [phone]

## 2019-04-10 NOTE — ED Triage Notes (Addendum)
Pt reports lower abdominal pressure since Thursday. Today she has had 2 bowel movements with blood. She has been taking tylenol for pain with some relief

## 2019-04-10 NOTE — ED Provider Notes (Signed)
Medical screening examination/treatment/procedure(s) were conducted as a shared visit with non-physician practitioner(s) and myself.  I personally evaluated the patient during the encounter.  Clinical Impression:   Final diagnoses:  Colitis    This patient presents with a couple of days of discomfort in the lower pelvis and R and LLQ - has no fever - sx are for the last 2 days - soft and mildly ttp on the L on my exam - obese but non peritoneal and no distress.  Had 2 BM's with some ? Blood - likely colitis - given CT report and labs - pt agreeable to abx and f/u with GI - had neg colo X 4 years ago according to her report.,    Noemi Chapel, MD 04/10/19 2322

## 2019-04-10 NOTE — Discharge Instructions (Addendum)
You are seen today for abdominal pain and bloody stools.  It appears that you have an infection in your colon. We are going to start you on an antibiotic for treatment. Stay hydrated with lots of fluid. Start with a plain bland diet and slowly advance it as tolerated. Thank you for allowing me to care for you today. Please return to the emergency department if you have new or worsening symptoms, specifically if you have fever or are unable to stay hydrated. Take your medications as instructed.

## 2019-04-10 NOTE — ED Notes (Signed)
ED Provider at bedside. Dr Miller  

## 2019-05-05 ENCOUNTER — Encounter: Payer: Self-pay | Admitting: Women's Health

## 2019-05-28 ENCOUNTER — Other Ambulatory Visit: Payer: Self-pay

## 2019-05-31 ENCOUNTER — Encounter: Payer: Self-pay | Admitting: Women's Health

## 2019-05-31 ENCOUNTER — Other Ambulatory Visit: Payer: Self-pay

## 2019-05-31 ENCOUNTER — Ambulatory Visit (INDEPENDENT_AMBULATORY_CARE_PROVIDER_SITE_OTHER): Payer: BC Managed Care – PPO | Admitting: Women's Health

## 2019-05-31 VITALS — BP 130/80 | Ht 64.0 in | Wt 261.0 lb

## 2019-05-31 DIAGNOSIS — Z01419 Encounter for gynecological examination (general) (routine) without abnormal findings: Secondary | ICD-10-CM | POA: Diagnosis not present

## 2019-05-31 NOTE — Progress Notes (Signed)
Cassidy Giles 02-10-1966 [id number]    History:    Presents for annual exam.  Postmenopausal on no HRT with no bleeding.  Normal Pap and mammogram history.  Primary care manages hypertension (med) and diabetes (no med).  Sister breast cancer survivor, BRCA status unknown, niece BRCA negative.  05/2014 benign colon polyp 5-year follow-up 1/21, after a bout of diverticulitis which showed one benign polyp and diverticulosis..  Not sexually active, denies need for STD screen.  Past medical history, past surgical history, family history and social history were all reviewed and  Documented in the EPIC chart.  Desk job works in Engineer, maintenance.  Participated in a weight loss program at [facility] weight is down 6 pounds, stopped last year due to Covid but does plan to start back.  ROS:  A ROS was performed and pertinent positives and negatives are included.  Exam:  Vitals:   05/31/19 0926  BP: 130/80  Weight: 261 lb (118.4 kg)  Height: _0  (1.626 m)   Body mass index is 44.8 kg/m.   General appearance:  Normal Thyroid:  Symmetrical, normal in size, without palpable masses or nodularity. Respiratory  Auscultation:  Clear without wheezing or rhonchi Cardiovascular  Auscultation:  Regular rate, without rubs, murmurs or gallops  Edema/varicosities:  Not grossly evident Abdominal  Soft,nontender, without masses, guarding or rebound.  Liver/spleen:  No organomegaly noted  Hernia:  None appreciated  Skin  Inspection:  Grossly normal   Breasts: Examined lying and sitting.     Right: Without masses, retractions, discharge or axillary adenopathy.     Left: Without masses, retractions, discharge or axillary adenopathy. Gentitourinary   Inguinal/mons:  Normal without inguinal adenopathy  External genitalia:  Normal  BUS/Urethra/Skene's glands:  Normal  Vagina:  Normal  Cervix:  Normal  Uterus:  normal in size, shape and contour.  Midline and mobile  Adnexa/parametria:     Rt: Without  masses or tenderness.   Lt: Without masses or tenderness.  Anus and perineum: Normal  Digital rectal exam: Normal sphincter tone without palpated masses or tenderness  Assessment/Plan:  54 y.o. S BF G0 for annual exam with no complaints.  Postmenopausal no HRT with no bleeding 04/2019 colonoscopy benign polyp, diverticulosis-bout of diverticulitis 2 months ago.    Diabetes, hypertension-primary care manages labs and meds Obesity  Plan: SBEs, continue annual 3D screening mammogram, calcium rich foods, vitamin D 2000 IUs daily.  Reviewed importance of increasing exercise, 30-minute brisk walk daily encouraged and decreasing calories/carbs, encouraged to return to [facility] management program.  Pap normal 04/2016 new screening guidelines reviewed.  [place], 9:43 AM 05/31/2019

## 2019-05-31 NOTE — Patient Instructions (Signed)
It was good to see you today, thank you for being understanding and coming in a little later.  I appreciate it. Vitamin D 2000 IU daily Health Maintenance for Postmenopausal Women Menopause is a normal process in which your ability to get pregnant comes to an end. This process happens slowly over many months or years, usually between the ages of 13 and 44. Menopause is complete when you have missed your menstrual periods for 12 months. It is important to talk with your health care provider about some of the most common conditions that affect women after menopause (postmenopausal women). These include heart disease, cancer, and bone loss (osteoporosis). Adopting a healthy lifestyle and getting preventive care can help to promote your health and wellness. The actions you take can also lower your chances of developing some of these common conditions. What should I know about menopause? During menopause, you may get a number of symptoms, such as:  Hot flashes. These can be moderate or severe.  Night sweats.  Decrease in sex drive.  Mood swings.  Headaches.  Tiredness.  Irritability.  Memory problems.  Insomnia. Choosing to treat or not to treat these symptoms is a decision that you make with your health care provider. Do I need hormone replacement therapy?  Hormone replacement therapy is effective in treating symptoms that are caused by menopause, such as hot flashes and night sweats.  Hormone replacement carries certain risks, especially as you become older. If you are thinking about using estrogen or estrogen with progestin, discuss the benefits and risks with your health care provider. What is my risk for heart disease and stroke? The risk of heart disease, heart attack, and stroke increases as you age. One of the causes may be a change in the body's hormones during menopause. This can affect how your body uses dietary fats, triglycerides, and cholesterol. Heart attack and stroke are  medical emergencies. There are many things that you can do to help prevent heart disease and stroke. Watch your blood pressure  High blood pressure causes heart disease and increases the risk of stroke. This is more likely to develop in people who have high blood pressure readings, are of African descent, or are overweight.  Have your blood pressure checked: ? Every 3-5 years if you are 76-35 years of age. ? Every year if you are 47 years old or older. Eat a healthy diet   Eat a diet that includes plenty of vegetables, fruits, low-fat dairy products, and lean protein.  Do not eat a lot of foods that are high in solid fats, added sugars, or sodium. Get regular exercise Get regular exercise. This is one of the most important things you can do for your health. Most adults should:  Try to exercise for at least 150 minutes each week. The exercise should increase your heart rate and make you sweat (moderate-intensity exercise).  Try to do strengthening exercises at least twice each week. Do these in addition to the moderate-intensity exercise.  Spend less time sitting. Even light physical activity can be beneficial. Other tips  Work with your health care provider to achieve or maintain a healthy weight.  Do not use any products that contain nicotine or tobacco, such as cigarettes, e-cigarettes, and chewing tobacco. If you need help quitting, ask your health care provider.  Know your numbers. Ask your health care provider to check your cholesterol and your blood sugar (glucose). Continue to have your blood tested as directed by your health care provider. Do  I need screening for cancer? Depending on your health history and family history, you may need to have cancer screening at different stages of your life. This may include screening for:  Breast cancer.  Cervical cancer.  Lung cancer.  Colorectal cancer. What is my risk for osteoporosis? After menopause, you may be at increased  risk for osteoporosis. Osteoporosis is a condition in which bone destruction happens more quickly than new bone creation. To help prevent osteoporosis or the bone fractures that can happen because of osteoporosis, you may take the following actions:  If you are 54-3 years old, get at least 1,000 mg of calcium and at least 600 mg of vitamin D per day.  If you are older than age 79 but younger than age 63, get at least 1,200 mg of calcium and at least 600 mg of vitamin D per day.  If you are older than age 39, get at least 1,200 mg of calcium and at least 800 mg of vitamin D per day. Smoking and drinking excessive alcohol increase the risk of osteoporosis. Eat foods that are rich in calcium and vitamin D, and do weight-bearing exercises several times each week as directed by your health care provider. How does menopause affect my mental health? Depression may occur at any age, but it is more common as you become older. Common symptoms of depression include:  Low or sad mood.  Changes in sleep patterns.  Changes in appetite or eating patterns.  Feeling an overall lack of motivation or enjoyment of activities that you previously enjoyed.  Frequent crying spells. Talk with your health care provider if you think that you are experiencing depression. General instructions See your health care provider for regular wellness exams and vaccines. This may include:  Scheduling regular health, dental, and eye exams.  Getting and maintaining your vaccines. These include: ? Influenza vaccine. Get this vaccine each year before the flu season begins. ? Pneumonia vaccine. ? Shingles vaccine. ? Tetanus, diphtheria, and pertussis (Tdap) booster vaccine. Your health care provider may also recommend other immunizations. Tell your health care provider if you have ever been abused or do not feel safe at home. Summary  Menopause is a normal process in which your ability to get pregnant comes to an end.   This condition causes hot flashes, night sweats, decreased interest in sex, mood swings, headaches, or lack of sleep.  Treatment for this condition may include hormone replacement therapy.  Take actions to keep yourself healthy, including exercising regularly, eating a healthy diet, watching your weight, and checking your blood pressure and blood sugar levels.  Get screened for cancer and depression. Make sure that you are up to date with all your vaccines. This information is not intended to replace advice given to you by your health care provider. Make sure you discuss any questions you have with your health care provider. Document Revised: 04/08/2018 Document Reviewed: 04/08/2018 Elsevier Patient Education  2020 [employer].

## 2020-10-05 ENCOUNTER — Other Ambulatory Visit: Payer: Self-pay

## 2020-10-05 ENCOUNTER — Other Ambulatory Visit (HOSPITAL_BASED_OUTPATIENT_CLINIC_OR_DEPARTMENT_OTHER): Payer: Self-pay

## 2020-10-05 ENCOUNTER — Emergency Department (HOSPITAL_BASED_OUTPATIENT_CLINIC_OR_DEPARTMENT_OTHER)
Admission: EM | Admit: 2020-10-05 | Discharge: 2020-10-05 | Disposition: A | Discharge status: Home / Self Care | Payer: BC Managed Care – PPO | Attending: Emergency Medicine | Admitting: Emergency Medicine

## 2020-10-05 ENCOUNTER — Emergency Department (HOSPITAL_BASED_OUTPATIENT_CLINIC_OR_DEPARTMENT_OTHER): Payer: BC Managed Care – PPO

## 2020-10-05 ENCOUNTER — Encounter (HOSPITAL_BASED_OUTPATIENT_CLINIC_OR_DEPARTMENT_OTHER): Payer: Self-pay | Admitting: Emergency Medicine

## 2020-10-05 DIAGNOSIS — E119 Type 2 diabetes mellitus without complications: Secondary | ICD-10-CM | POA: Diagnosis not present

## 2020-10-05 DIAGNOSIS — N1330 Unspecified hydronephrosis: Secondary | ICD-10-CM

## 2020-10-05 DIAGNOSIS — R1031 Right lower quadrant pain: Secondary | ICD-10-CM | POA: Diagnosis present

## 2020-10-05 DIAGNOSIS — N132 Hydronephrosis with renal and ureteral calculous obstruction: Secondary | ICD-10-CM | POA: Diagnosis not present

## 2020-10-05 LAB — URINALYSIS, MICROSCOPIC (REFLEX)

## 2020-10-05 LAB — URINALYSIS, ROUTINE W REFLEX MICROSCOPIC
Bilirubin Urine: NEGATIVE
Glucose, UA: 100 mg/dL — AB
Ketones, ur: NEGATIVE mg/dL
Leukocytes,Ua: NEGATIVE
Nitrite: NEGATIVE
Protein, ur: NEGATIVE mg/dL
Specific Gravity, Urine: 1.015 (ref 1.005–1.030)
pH: 7.5 (ref 5.0–8.0)

## 2020-10-05 LAB — CBC WITH DIFFERENTIAL/PLATELET
Abs Immature Granulocytes: 0.09 10*3/uL — ABNORMAL HIGH (ref 0.00–0.07)
Basophils Absolute: 0 10*3/uL (ref 0.0–0.1)
Basophils Relative: 0 %
Eosinophils Absolute: 0 10*3/uL (ref 0.0–0.5)
Eosinophils Relative: 0 %
HCT: 37.7 % (ref 36.0–46.0)
Hemoglobin: 12.3 g/dL (ref 12.0–15.0)
Immature Granulocytes: 1 %
Lymphocytes Relative: 12 %
Lymphs Abs: 1.4 10*3/uL (ref 0.7–4.0)
MCH: 26.2 pg (ref 26.0–34.0)
MCHC: 32.6 g/dL (ref 30.0–36.0)
MCV: 80.4 fL (ref 80.0–100.0)
Monocytes Absolute: 0.6 10*3/uL (ref 0.1–1.0)
Monocytes Relative: 5 %
Neutro Abs: 10.2 10*3/uL — ABNORMAL HIGH (ref 1.7–7.7)
Neutrophils Relative %: 82 %
Platelets: 249 10*3/uL (ref 150–400)
RBC: 4.69 MIL/uL (ref 3.87–5.11)
RDW: 13.2 % (ref 11.5–15.5)
WBC: 12.3 10*3/uL — ABNORMAL HIGH (ref 4.0–10.5)
nRBC: 0 % (ref 0.0–0.2)

## 2020-10-05 LAB — BASIC METABOLIC PANEL
Anion gap: 11 (ref 5–15)
BUN: 19 mg/dL (ref 6–20)
CO2: 25 mmol/L (ref 22–32)
Calcium: 9.4 mg/dL (ref 8.9–10.3)
Chloride: 100 mmol/L (ref 98–111)
Creatinine, Ser: 0.95 mg/dL (ref 0.44–1.00)
GFR, Estimated: >60 mL/min (ref >60)
Glucose, Bld: 188 mg/dL — ABNORMAL HIGH (ref 70–99)
Potassium: 3.4 mmol/L — ABNORMAL LOW (ref 3.5–5.1)
Sodium: 136 mmol/L (ref 135–145)

## 2020-10-05 MED ORDER — IOHEXOL 300 MG/ML  SOLN
100.0000 mL | Freq: Once | INTRAMUSCULAR | Status: AC | PRN
  Administered 2020-10-05: 100 mL via INTRAVENOUS

## 2020-10-05 MED ORDER — OXYCODONE-ACETAMINOPHEN 5-325 MG PO TABS
1.0000 | ORAL_TABLET | Freq: Four times a day (QID) | ORAL | 0 refills | Status: DC | PRN
  Filled 2020-10-05: qty 15, 4d supply, fill #0

## 2020-10-05 MED ORDER — ONDANSETRON HCL 4 MG/2ML IJ SOLN
4.0000 mg | Freq: Once | INTRAMUSCULAR | Status: AC
  Administered 2020-10-05: 4 mg via INTRAVENOUS
  Filled 2020-10-05: qty 2

## 2020-10-05 MED ORDER — LACTATED RINGERS IV BOLUS
1000.0000 mL | Freq: Once | INTRAVENOUS | Status: AC
  Administered 2020-10-05: 1000 mL via INTRAVENOUS

## 2020-10-05 MED ORDER — HYDROMORPHONE HCL 1 MG/ML IJ SOLN
1.0000 mg | Freq: Once | INTRAMUSCULAR | Status: AC
  Administered 2020-10-05: 1 mg via INTRAVENOUS
  Filled 2020-10-05: qty 1

## 2020-10-05 MED ORDER — OXYCODONE-ACETAMINOPHEN 5-325 MG PO TABS
2.0000 | ORAL_TABLET | Freq: Once | ORAL | Status: AC
  Administered 2020-10-05: 2 via ORAL
  Filled 2020-10-05: qty 2

## 2020-10-05 MED ORDER — FENTANYL CITRATE (PF) 100 MCG/2ML IJ SOLN
100.0000 ug | Freq: Once | INTRAMUSCULAR | Status: AC
  Administered 2020-10-05: 100 ug via INTRAVENOUS
  Filled 2020-10-05: qty 2

## 2020-10-05 NOTE — ED Triage Notes (Signed)
Pt presents to ED POV. Pt c/o RLQ abd pain, emesis, co;d chills. Pt reports pain began suddenly tonight.

## 2020-10-05 NOTE — Discharge Instructions (Addendum)
You have swelling that was seen on the CAT scan today of your right kidney.  You do have a kidney stone in your kidney but it does not look like it is causing the blockage.  However you will need to see the specialist to make sure that this improves.  This is definitely what is causing your pain today.  Use the pain medication 1 to 2 tablets every 6 hours as needed.  You can also use ibuprofen to help with the pain.  If you develop severe pain despite the medication vomiting or if you start running a fever you should go to [facility] so that you can be seen by the specialist.

## 2020-10-05 NOTE — ED Provider Notes (Signed)
[place] [facility] Provider Note: Georgena Spurling, MD, FACEP  CSN: [id number] MRN: [id number] ARRIVAL: 10/05/20 at [facility]  Abdominal Pain   HISTORY OF PRESENT ILLNESS  10/05/20 5:28 AM Cassidy Giles is a 55 y.o. female who was awakened from her sleep about midnight this morning with right lower quadrant pain.  She describes the pain is sharp and rates it as a 10 out of 10.  It is somewhat worse when she lies on her right side.  It is not significantly worse with movement or palpation.  She has had associated chills, nausea and vomiting.  She has not had diarrhea or fever.  She states she has been urinating more frequently than usual.   Past Medical History:  Diagnosis Date   Anemia    Diabetes ([place])    Fibroid    Gallbladder problem    High blood pressure    High cholesterol    Obesity    Pelvic kidney    right    Past Surgical History:  Procedure Laterality Date   CHOLECYSTECTOMY     HAND SURGERY     RIGHT   INTRAUTERINE DEVICE INSERTION  01/06/2006   MIRENA   KNEE SURGERY     RIGHT   KNEE SURGERY  3/12017    Family History  Problem Relation Age of Onset   Diabetes Mother    Hypertension Mother    Cancer Mother        UTERINE   Heart disease Mother    High Cholesterol Mother    Sudden death Mother    Stroke Mother    Depression Mother    Diabetes Sister    Hypertension Sister    Breast cancer Sister    Diabetes Brother    Cancer Brother        LUNG/LIVER...DECEASED 2009/07/29   Hypertension Brother     Social History   Tobacco Use   Smoking status: Never   Smokeless tobacco: Never  Vaping Use   Vaping Use: Never used  Substance Use Topics   Alcohol use: Not Currently   Drug use: No    Prior to Admission medications   Medication Sig Start Date End Date Taking? Authorizing Provider  oxyCODONE-acetaminophen (PERCOCET/ROXICET) 5-325 MG tablet Take 1 tablet by mouth every 6 (six) hours as needed for severe pain.  10/05/20  Yes Blanchie Dessert, MD  acetaminophen (TYLENOL) 500 MG tablet Take 1,000 mg by mouth every 6 (six) hours as needed for mild pain.    [provider]  Multiple Vitamin (MULTIVITAMIN) tablet Take 1 tablet by mouth daily.      [provider]  valsartan-hydrochlorothiazide (DIOVAN HCT) 160-12.5 MG tablet Take 1 tablet by mouth daily. 01/28/19   Jearld Lesch A, DO    Allergies Other   REVIEW OF SYSTEMS  Negative except as noted here or in the History of Present Illness.   PHYSICAL EXAMINATION  Initial Vital Signs Blood pressure (!) 176/86, pulse 93, temperature 99.1 F (37.3 C), temperature source Oral, resp. rate 20, height 5\' 5"  (1.651 m), weight 118.8 kg, last menstrual period 03/21/2017, SpO2 100 %.  Examination General: Well-developed, well-nourished female in no acute distress; appearance consistent with age of record HENT: normocephalic; atraumatic Eyes: pupils equal, round and reactive to light; extraocular muscles intact; arcus senilis bilaterally Neck: supple Heart: regular rate and rhythm Lungs: clear to auscultation bilaterally Abdomen: soft; nondistended; nontender; bowel sounds present GU: No CVA tenderness Extremities: No deformity;  full range of motion; pulses normal Neurologic: Awake, alert and oriented; motor function intact in all extremities and symmetric; no facial droop Skin: Warm and dry Psychiatric: Grimacing   RESULTS  Summary of this visit's results, reviewed and interpreted by myself:   EKG Interpretation  Date/Time:    Ventricular Rate:    PR Interval:    QRS Duration:   QT Interval:    QTC Calculation:   R Axis:     Text Interpretation:          Laboratory Studies: Results for orders placed or performed during the hospital encounter of 10/05/20 (from the past 24 hour(s))  CBC with Differential/Platelet     Status: Abnormal   Collection Time: 10/05/20  5:38 AM  Result Value Ref Range   WBC 12.3 (H) 4.0 - 10.5  K/uL   RBC 4.69 3.87 - 5.11 MIL/uL   Hemoglobin 12.3 12.0 - 15.0 g/dL   HCT 37.7 36.0 - 46.0 %   MCV 80.4 80.0 - 100.0 fL   MCH 26.2 26.0 - 34.0 pg   MCHC 32.6 30.0 - 36.0 g/dL   RDW 13.2 11.5 - 15.5 %   Platelets 249 150 - 400 K/uL   nRBC 0.0 0.0 - 0.2 %   Neutrophils Relative % 82 %   Neutro Abs 10.2 (H) 1.7 - 7.7 K/uL   Lymphocytes Relative 12 %   Lymphs Abs 1.4 0.7 - 4.0 K/uL   Monocytes Relative 5 %   Monocytes Absolute 0.6 0.1 - 1.0 K/uL   Eosinophils Relative 0 %   Eosinophils Absolute 0.0 0.0 - 0.5 K/uL   Basophils Relative 0 %   Basophils Absolute 0.0 0.0 - 0.1 K/uL   Immature Granulocytes 1 %   Abs Immature Granulocytes 0.09 (H) 0.00 - 0.07 K/uL  Basic metabolic panel     Status: Abnormal   Collection Time: 10/05/20  5:38 AM  Result Value Ref Range   Sodium 136 135 - 145 mmol/L   Potassium 3.4 (L) 3.5 - 5.1 mmol/L   Chloride 100 98 - 111 mmol/L   CO2 25 22 - 32 mmol/L   Glucose, Bld 188 (H) 70 - 99 mg/dL   BUN 19 6 - 20 mg/dL   Creatinine, Ser 0.95 0.44 - 1.00 mg/dL   Calcium 9.4 8.9 - 10.3 mg/dL   GFR, Estimated >60 >60 mL/min   Anion gap 11 5 - 15  Urinalysis, Routine w reflex microscopic Urine, Clean Catch     Status: Abnormal   Collection Time: 10/05/20  5:38 AM  Result Value Ref Range   Color, Urine YELLOW YELLOW   APPearance CLEAR CLEAR   Specific Gravity, Urine 1.015 1.005 - 1.030   pH 7.5 5.0 - 8.0   Glucose, UA 100 (A) NEGATIVE mg/dL   Hgb urine dipstick TRACE (A) NEGATIVE   Bilirubin Urine NEGATIVE NEGATIVE   Ketones, ur NEGATIVE NEGATIVE mg/dL   Protein, ur NEGATIVE NEGATIVE mg/dL   Nitrite NEGATIVE NEGATIVE   Leukocytes,Ua NEGATIVE NEGATIVE  Urinalysis, Microscopic (reflex)     Status: Abnormal   Collection Time: 10/05/20  5:38 AM  Result Value Ref Range   RBC / HPF 6-10 0 - 5 RBC/hpf   WBC, UA 6-10 0 - 5 WBC/hpf   Bacteria, UA FEW (A) NONE SEEN   Squamous Epithelial / LPF 6-10 0 - 5   Imaging Studies: CT ABDOMEN PELVIS WO  CONTRAST  Result Date: 10/05/2020 CLINICAL DATA:  Right lower quadrant pain emesis and chills  EXAM: CT ABDOMEN AND PELVIS WITHOUT CONTRAST TECHNIQUE: Multidetector CT imaging of the abdomen and pelvis was performed following the standard protocol without IV contrast. COMPARISON:  04/10/2019 FINDINGS: Lower chest:  Small sliding hiatal hernia. Hepatobiliary: Hepatic steatosis.Cholecystectomy without bile duct dilatation. Pancreas: Unremarkable. Spleen: Unremarkable. Adrenals/Urinary Tract: Negative adrenals. Right pelvic kidney with marked adjacent fat stranding and low-density cortical expansion. There is a stone at the hilum measuring 7 mm, stable and not clearly associated with the renal pelvis or ureter. No hyperdensity of the renal veins but enhanced CT or ultrasound will be suggested. Normal left kidney. Unremarkable bladder. Stomach/Bowel: No obstruction. No visible bowel inflammation. Mild colonic diverticulosis. Vascular/Lymphatic: No acute vascular abnormality. No mass or adenopathy. Reproductive:Exophytic fibroid measuring 2.4 cm. Other: No ascites or pneumoperitoneum. Musculoskeletal: No acute abnormalities. Lower thoracic disc and lumbar facet degeneration. IMPRESSION: 1. Marked edema around the right pelvic kidney. There is a 7 mm renal stone which is stably positioned and not visibly obstructive, question pyelonephritis symptoms. Consider enhanced CT or renal ultrasound to evaluate enhancement pattern and renal vasculature. 2. Hepatic steatosis. Electronically Signed   By: Monte Fantasia M.D.   On: 10/05/2020 06:25   CT ABDOMEN PELVIS W CONTRAST  Result Date: 10/05/2020 CLINICAL DATA:  Follow-up abnormal CT.  Right lower quadrant pain EXAM: CT ABDOMEN AND PELVIS WITH CONTRAST TECHNIQUE: Multidetector CT imaging of the abdomen and pelvis was performed using the standard protocol following bolus administration of intravenous contrast. CONTRAST:  131mL OMNIPAQUE IOHEXOL 300 MG/ML  SOLN COMPARISON:   Noncontrast CT from earlier today FINDINGS: Lower chest:  Tiny sliding hiatal hernia. Hepatobiliary: Hepatic steatosis.Cholecystectomy. No bile duct dilatation. Pancreas: Unremarkable. Spleen: Unremarkable. Adrenals/Urinary Tract: Negative adrenals. Right pelvic kidney with diffuse delayed renal enhancement and marked perinephric stranding. The cortex is homogeneously enhancing for the contrast phase, not typical of pyelonephritis. There is mild hydronephrosis and hydroureter but no visible obstructing stone. 8 mm right renal calculus in stable position from prior. The renal arteries and veins are ectopic but enhancing. Negative left kidney. Negative urinary bladder. Stomach/Bowel: No obstruction. No appendicitis. Left colonic diverticulosis. Vascular/Lymphatic: No acute vascular abnormality. No mass or adenopathy. Reproductive:Exophytic uterine fibroid extending ventrally and measuring 2 cm Other: No ascites or pneumoperitoneum. Musculoskeletal: No acute abnormalities. Lower thoracic disc and lower lumbar facet degeneration. Sacroiliac degeneration and vacuum phenomenon. IMPRESSION: 1. Marked perinephric edema of the pelvic kidney which shows hydroureteronephrosis and delayed renal enhancement but no visible obstructing cause. Question ascending infection or hematuria. 2. 8 mm right renal calculus. 3. Hepatic steatosis. Electronically Signed   By: Monte Fantasia M.D.   On: 10/05/2020 07:31    ED COURSE and MDM  Nursing notes, initial and subsequent vitals signs, including pulse oximetry, reviewed and interpreted by myself.  Vitals:   10/05/20 0610 10/05/20 0630 10/05/20 0705 10/05/20 0817  BP: (!) 175/83 (!) 152/95 (!) 155/132 (!) 189/73  Pulse: 83 92 95 91  Resp: 20  20 18   Temp:   99.3 F (37.4 C) 99.3 F (37.4 C)  TempSrc:    Oral  SpO2: 100% 100% 97% 100%  Weight:      Height:       Medications  iohexol (OMNIPAQUE) 300 MG/ML solution 100 mL (has no administration in time range)  lactated  ringers bolus 1,000 mL (1,000 mLs Intravenous New Bag/Given 10/05/20 0542)  ondansetron (ZOFRAN) injection 4 mg (4 mg Intravenous Given 10/05/20 0543)  fentaNYL (SUBLIMAZE) injection 100 mcg (100 mcg Intravenous Given 10/05/20 0543)  HYDROmorphone (DILAUDID) injection  1 mg (1 mg Intravenous Given 10/05/20 0640)   7:00 AM Patient's pain improved with IV medications.  Repeat CT with contrast to evaluate pelvic kidney abnormality pending.  Signed out to Dr. Maryan Rued.   PROCEDURES  Procedures   ED DIAGNOSES     ICD-10-CM   1. Hydronephrosis of right kidney  N13.30          Hazely Sealey, MD 10/05/20 2237

## 2020-10-05 NOTE — ED Provider Notes (Signed)
CT with contrast shows marked perinephric edema of the pelvic kidney with hydroureteronephrosis and delayed renal enhancement but no visible obstructing cause.  She does have an 8 mm right renal calculus.  There is question of a sending infection or hematuria.  Patient urine with trace hemoglobin and contaminated but only 6-10 white and red cells.  Low suspicion for infection at this time as patient was feeling completely normal until waking up at midnight with the severe pain.  On repeat evaluation now pain is a 5 out of 10.  Vomiting has subsided.  Will discuss with urology given the above findings.  Patient given oral pain medication.  9:00 AM Spoke with Dr. Abner Greenspan with urology and he recommended outpt follow up.  Pt is reasonably pain controlled at this time.  No obvious obstructing stone by CT but pt does have hydronephrosis which is new.  No obvious signs of infection and time line not consistent with pyelonephritis.  Will d/c with pain control and return precautions.   Blanchie Dessert, MD 10/05/20 [phone]

## 2020-10-13 ENCOUNTER — Other Ambulatory Visit (HOSPITAL_BASED_OUTPATIENT_CLINIC_OR_DEPARTMENT_OTHER): Payer: Self-pay

## 2020-10-31 ENCOUNTER — Other Ambulatory Visit: Payer: Self-pay | Admitting: Urology

## 2020-11-08 ENCOUNTER — Encounter (HOSPITAL_BASED_OUTPATIENT_CLINIC_OR_DEPARTMENT_OTHER): Payer: Self-pay | Admitting: Urology

## 2020-11-09 ENCOUNTER — Other Ambulatory Visit: Payer: Self-pay

## 2020-11-09 ENCOUNTER — Encounter (HOSPITAL_BASED_OUTPATIENT_CLINIC_OR_DEPARTMENT_OTHER): Payer: Self-pay | Admitting: Urology

## 2020-11-09 DIAGNOSIS — Z972 Presence of dental prosthetic device (complete) (partial): Secondary | ICD-10-CM

## 2020-11-09 DIAGNOSIS — L309 Dermatitis, unspecified: Secondary | ICD-10-CM

## 2020-11-09 DIAGNOSIS — Z973 Presence of spectacles and contact lenses: Secondary | ICD-10-CM

## 2020-11-09 HISTORY — DX: Presence of spectacles and contact lenses: Z97.3

## 2020-11-09 HISTORY — DX: Presence of dental prosthetic device (complete) (partial): Z97.2

## 2020-11-09 HISTORY — DX: Dermatitis, unspecified: L30.9

## 2020-11-09 NOTE — Progress Notes (Signed)
Spoke w/ via phone for pre-op interview---pt Lab needs dos----  I stat, ekg             Lab results------none COVID test -----patient states asymptomatic no test needed Arrive at -------615 am  11-14-2020 NPO after MN NO Solid Food.  Clear liquids from MN until---515 am then npo Med rec completed Medications to take morning of surgery -----none Diabetic medication -----none day of surgery Patient instructed no nail polish to be worn day of surgery Patient instructed to bring photo id and insurance card day of surgery Patient aware to have Driver (ride ) / caregiver  sister pam Smiddy will drop pt off  for 24 hours after surgery  Patient Special Instructions -----none Pre-Op special Istructions -----none Patient verbalized understanding of instructions that were given at this phone interview. Patient denies shortness of breath, chest pain, fever, cough at this phone interview.

## 2020-11-13 NOTE — Anesthesia Preprocedure Evaluation (Addendum)
Anesthesia Evaluation  Patient identified by MRN, date of birth, ID band Patient awake    Reviewed: Allergy & Precautions, NPO status , Patient's Chart, lab work & pertinent test results  History of Anesthesia Complications Negative for: history of anesthetic complications  Airway Mallampati: II  TM Distance: >3 FB Neck ROM: Full    Dental  (+) Partial Upper, Dental Advisory Given   Pulmonary neg pulmonary ROS,    Pulmonary exam normal        Cardiovascular hypertension, Pt. on medications Normal cardiovascular exam     Neuro/Psych negative neurological ROS     GI/Hepatic negative GI ROS, Neg liver ROS,   Endo/Other  diabetesMorbid obesity  Renal/GU      Musculoskeletal negative musculoskeletal ROS (+)   Abdominal   Peds  Hematology negative hematology ROS (+)   Anesthesia Other Findings   Reproductive/Obstetrics                            Anesthesia Physical Anesthesia Plan  ASA: 3  Anesthesia Plan: General   Post-op Pain Management:    Induction: Intravenous  PONV Risk Score and Plan: 3 and Ondansetron, Dexamethasone and Midazolam  Airway Management Planned: LMA  Additional Equipment:   Intra-op Plan:   Post-operative Plan: Extubation in OR  Informed Consent: I have reviewed the patients History and Physical, chart, labs and discussed the procedure including the risks, benefits and alternatives for the proposed anesthesia with the patient or authorized representative who has indicated his/her understanding and acceptance.     Dental advisory given  Plan Discussed with: Anesthesiologist and CRNA  Anesthesia Plan Comments:        Anesthesia Quick Evaluation

## 2020-11-13 NOTE — H&P (Signed)
CC: I have pain in the flank.  HPI: Cassidy Giles is a 55 year-old female established patient who is here for flank pain.  10/06/2020: She developed right pelvic pain yesterday and went to the ER. CT showed delayed nephrogram on right pelvic kidney, perinephric edema, mild hydronephrosis and a 75mm right renal calculus.   10/20/2020: 55 year old female who was treated for urinary tract infection at last office visit presents today for follow-up regarding her 8 mm right renal calculus. She denies interval flank pain, gross hematuria, passage of stone material. She denies fevers and chills. She reports her symptoms have resolved and she is ready to proceed with further definitive stone intervention. This is her 1st stone event.   The problem is on the right side. Her pain started about approximately 10/04/2020. The pain is sharp. The intensity of her pain is rated as a 10. The pain is intermittent. The pain does not radiate.   Nothing causes the pain to become worse. She was treated with the following pain medication(s): Percocet.   She has not had this same pain previously. She has not had kidney stones.     ALLERGIES: None   MEDICATIONS: Cefuroxime 500 mg tablet 1 tablet PO BID  Ezetimibe 10 mg tablet  Hydralazine Hcl 25 mg tablet  Ozempic     GU PSH: No GU PSH    NON-GU PSH: Cholecystectomy (laparoscopic) - 2002 Hand/finger Surgery Knee Arthroscopy - 2015     GU PMH: Flank Pain - 10/06/2020    NON-GU PMH: Diabetes Type 2 Hypercholesterolemia Hypertension    FAMILY HISTORY: No Children - No Family History   SOCIAL HISTORY: Marital Status: Single Current Smoking Status: Patient has never smoked.   Tobacco Use Assessment Completed: Used Tobacco in last 30 days? Drinks 1 drink per day.  Drinks 2 caffeinated drinks per day. 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    REVIEW OF SYSTEMS:    GU Review Female:   Patient denies frequent urination, hard to postpone  urination, burning /pain with urination, get up at night to urinate, leakage of urine, stream starts and stops, trouble starting your stream, have to strain to urinate, and being pregnant.  Gastrointestinal (Upper):   Patient denies nausea, vomiting, and indigestion/ heartburn.  Gastrointestinal (Lower):   Patient denies diarrhea and constipation.  Constitutional:   Patient denies fever, night sweats, weight loss, and fatigue.  Musculoskeletal:   Patient denies back pain and joint pain.  Neurological:   Patient denies headaches and dizziness.  Psychologic:   Patient denies depression and anxiety.   VITAL SIGNS:      10/20/2020 10:01 AM  Weight 252 lb / 114.31 kg  Height 65 in / 165.1 cm  BP 157/71 mmHg  Pulse 84 /min  Temperature 98.0 F / 36.6 C  BMI 41.9 kg/m   MULTI-SYSTEM PHYSICAL EXAMINATION:    Constitutional: Well-nourished. No physical deformities. Normally developed. Good grooming.  Respiratory: No labored breathing, no use of accessory muscles.   Cardiovascular: Normal temperature, normal extremity pulses, no swelling, no varicosities.  Skin: No paleness, no jaundice, no cyanosis. No lesion, no ulcer, no rash.  Neurologic / Psychiatric: Oriented to time, oriented to place, oriented to person. No depression, no anxiety, no agitation.  Gastrointestinal: No mass, no tenderness, no rigidity, non obese abdomen.     Complexity of Data:  Source Of History:  Patient, Medical Record Summary  Records Review:   Previous Hospital Records, Previous Patient Records  Urine Test Review:   Urinalysis, Urine  Culture   10/20/20  Urinalysis  Urine Appearance Clear   Urine Color Amber   Urine Glucose Neg mg/dL  Urine Bilirubin Neg mg/dL  Urine Ketones Neg mg/dL  Urine Specific Gravity 1.025   Urine Blood Neg ery/uL  Urine pH 5.5   Urine Protein 1+ mg/dL  Urine Urobilinogen 0.2 mg/dL  Urine Nitrites Neg   Urine Leukocyte Esterase Neg leu/uL  Urine WBC/hpf 10 - 20/hpf   Urine RBC/hpf  0 - 2/hpf   Urine Epithelial Cells 0 - 5/hpf   Urine Bacteria Rare (0-9/hpf)   Urine Mucous Present   Urine Yeast NS (Not Seen)   Urine Trichomonas Not Present   Urine Cystals NS (Not Seen)   Urine Casts NS (Not Seen)   Urine Sperm Not Present    PROCEDURES:          Urinalysis w/Scope Dipstick Dipstick Cont'd Micro  Color: Amber Bilirubin: Neg mg/dL WBC/hpf: 10 - 20/hpf  Appearance: Clear Ketones: Neg mg/dL RBC/hpf: 0 - 2/hpf  Specific Gravity: 1.025 Blood: Neg ery/uL Bacteria: Rare (0-9/hpf)  pH: 5.5 Protein: 1+ mg/dL Cystals: NS (Not Seen)  Glucose: Neg mg/dL Urobilinogen: 0.2 mg/dL Casts: NS (Not Seen)    Nitrites: Neg Trichomonas: Not Present    Leukocyte Esterase: Neg leu/uL Mucous: Present      Epithelial Cells: 0 - 5/hpf      Yeast: NS (Not Seen)      Sperm: Not Present    ASSESSMENT:      ICD-10 Details  1 GU:   Flank Pain - R10.84 Right, Acute, Resolved  2   Renal calculus - N20.0 Right, Chronic, Stable   PLAN:           Schedule Return Visit/Planned Activity: Next Available Appointment - Schedule Surgery          Document Letter(s):  Created for Patient: Clinical Summary         Notes:   1. Flank pain: Her flank pain has resolved with treatment of her urinary tract infection.   2. Right renal calculus: For definitive stone intervention of her 8 mm right renal calculus we discussed ESWL as well as ureteroscopy.   For ESWL I discussed the procedure itself as well as the risks of hematoma, infection, failure of procedure to break up stone, risk of requiring a 2nd procedure, and the risk for bleeding.   For ureteroscopy I discussed the procedure itself as well as the risks of general anesthesia, the risks of infection, bleeding, injury to surrounding structures, failure of procedure and general risks of surgery.   She verbalized her understanding to these risks and would like to think about further intervention and will call our office with her decision. She  understands to return to the office with any worsening symptomatology. She was given strict return precautions in the interval.

## 2020-11-14 ENCOUNTER — Encounter (HOSPITAL_BASED_OUTPATIENT_CLINIC_OR_DEPARTMENT_OTHER): Payer: Self-pay | Admitting: Urology

## 2020-11-14 ENCOUNTER — Ambulatory Visit (HOSPITAL_BASED_OUTPATIENT_CLINIC_OR_DEPARTMENT_OTHER): Payer: BC Managed Care – PPO | Admitting: Anesthesiology

## 2020-11-14 ENCOUNTER — Ambulatory Visit (HOSPITAL_BASED_OUTPATIENT_CLINIC_OR_DEPARTMENT_OTHER)
Admission: RE | Admit: 2020-11-14 | Discharge: 2020-11-14 | Disposition: A | Discharge status: Home / Self Care | Payer: BC Managed Care – PPO | Attending: Urology | Admitting: Urology

## 2020-11-14 ENCOUNTER — Encounter (HOSPITAL_BASED_OUTPATIENT_CLINIC_OR_DEPARTMENT_OTHER)
Admission: RE | Disposition: A | Discharge status: Home / Self Care | Payer: Self-pay | Source: Home / Self Care | Attending: Urology

## 2020-11-14 DIAGNOSIS — Z79899 Other long term (current) drug therapy: Secondary | ICD-10-CM | POA: Diagnosis not present

## 2020-11-14 DIAGNOSIS — Z87440 Personal history of urinary (tract) infections: Secondary | ICD-10-CM | POA: Diagnosis not present

## 2020-11-14 DIAGNOSIS — N132 Hydronephrosis with renal and ureteral calculous obstruction: Secondary | ICD-10-CM | POA: Insufficient documentation

## 2020-11-14 HISTORY — PX: HOLMIUM LASER APPLICATION: SHX5852

## 2020-11-14 HISTORY — PX: CYSTOSCOPY WITH RETROGRADE PYELOGRAM, URETEROSCOPY AND STENT PLACEMENT: SHX5789

## 2020-11-14 HISTORY — DX: Calculus of kidney: N20.0

## 2020-11-14 HISTORY — DX: Hyperlipidemia, unspecified: E78.5

## 2020-11-14 HISTORY — DX: Diverticulosis of large intestine without perforation or abscess without bleeding: K57.30

## 2020-11-14 HISTORY — DX: Personal history of other diseases of the digestive system: Z87.19

## 2020-11-14 HISTORY — DX: Leiomyoma of uterus, unspecified: D25.9

## 2020-11-14 HISTORY — DX: Type 2 diabetes mellitus without complications: E11.9

## 2020-11-14 HISTORY — DX: Personal history of urinary calculi: Z87.442

## 2020-11-14 HISTORY — DX: Essential (primary) hypertension: I10

## 2020-11-14 LAB — GLUCOSE, CAPILLARY: Glucose-Capillary: 106 mg/dL — ABNORMAL HIGH (ref 70–99)

## 2020-11-14 LAB — POCT I-STAT, CHEM 8
BUN: 13 mg/dL (ref 6–20)
Calcium, Ion: 1.27 mmol/L (ref 1.15–1.40)
Chloride: 101 mmol/L (ref 98–111)
Creatinine, Ser: 0.9 mg/dL (ref 0.44–1.00)
Glucose, Bld: 125 mg/dL — ABNORMAL HIGH (ref 70–99)
HCT: 42 % (ref 36.0–46.0)
Hemoglobin: 14.3 g/dL (ref 12.0–15.0)
Potassium: 4.3 mmol/L (ref 3.5–5.1)
Sodium: 139 mmol/L (ref 135–145)
TCO2: 28 mmol/L (ref 22–32)

## 2020-11-14 SURGERY — CYSTOURETEROSCOPY, WITH RETROGRADE PYELOGRAM AND STENT INSERTION
Anesthesia: General | Site: Ureter | Laterality: Right

## 2020-11-14 MED ORDER — SODIUM CHLORIDE 0.9 % IR SOLN
Status: DC | PRN
  Administered 2020-11-14: 3000 mL

## 2020-11-14 MED ORDER — FENTANYL CITRATE (PF) 100 MCG/2ML IJ SOLN
INTRAMUSCULAR | Status: AC
  Filled 2020-11-14: qty 2

## 2020-11-14 MED ORDER — LIDOCAINE HCL (CARDIAC) PF 100 MG/5ML IV SOSY
PREFILLED_SYRINGE | INTRAVENOUS | Status: DC | PRN
  Administered 2020-11-14: 100 mg via INTRAVENOUS

## 2020-11-14 MED ORDER — IOHEXOL 300 MG/ML  SOLN
INTRAMUSCULAR | Status: DC | PRN
  Administered 2020-11-14: 10 mL via URETHRAL

## 2020-11-14 MED ORDER — ONDANSETRON HCL 4 MG/2ML IJ SOLN
INTRAMUSCULAR | Status: DC | PRN
  Administered 2020-11-14: 4 mg via INTRAVENOUS

## 2020-11-14 MED ORDER — SODIUM CHLORIDE 0.9 % IV SOLN
INTRAVENOUS | Status: AC
  Filled 2020-11-14: qty 2

## 2020-11-14 MED ORDER — LIDOCAINE HCL (PF) 2 % IJ SOLN
INTRAMUSCULAR | Status: AC
  Filled 2020-11-14: qty 5

## 2020-11-14 MED ORDER — SODIUM CHLORIDE 0.9 % IV SOLN
2.0000 g | INTRAVENOUS | Status: AC
  Administered 2020-11-14: 2 g via INTRAVENOUS

## 2020-11-14 MED ORDER — DEXAMETHASONE SODIUM PHOSPHATE 10 MG/ML IJ SOLN
INTRAMUSCULAR | Status: AC
  Filled 2020-11-14: qty 1

## 2020-11-14 MED ORDER — ACETAMINOPHEN 500 MG PO TABS
1000.0000 mg | ORAL_TABLET | Freq: Once | ORAL | Status: AC
  Administered 2020-11-14: 1000 mg via ORAL

## 2020-11-14 MED ORDER — PROPOFOL 10 MG/ML IV BOLUS
INTRAVENOUS | Status: DC | PRN
  Administered 2020-11-14: 200 mg via INTRAVENOUS

## 2020-11-14 MED ORDER — AMISULPRIDE (ANTIEMETIC) 5 MG/2ML IV SOLN
INTRAVENOUS | Status: AC
  Filled 2020-11-14: qty 4

## 2020-11-14 MED ORDER — GLYCOPYRROLATE 0.2 MG/ML IJ SOLN
INTRAMUSCULAR | Status: DC | PRN
  Administered 2020-11-14: .2 mg via INTRAVENOUS

## 2020-11-14 MED ORDER — OXYBUTYNIN CHLORIDE 5 MG PO TABS: 5.0000 mg | ORAL_TABLET | Freq: Three times a day (TID) | ORAL | 0 refills | Status: AC | PRN

## 2020-11-14 MED ORDER — TAMSULOSIN HCL 0.4 MG PO CAPS: 0.4000 mg | ORAL_CAPSULE | Freq: Every day | ORAL | 0 refills | Status: AC

## 2020-11-14 MED ORDER — ACETAMINOPHEN 500 MG PO TABS
ORAL_TABLET | ORAL | Status: AC
  Filled 2020-11-14: qty 2

## 2020-11-14 MED ORDER — SCOPOLAMINE 1 MG/3DAYS TD PT72
MEDICATED_PATCH | TRANSDERMAL | Status: AC
  Filled 2020-11-14: qty 1

## 2020-11-14 MED ORDER — FENTANYL CITRATE (PF) 100 MCG/2ML IJ SOLN
INTRAMUSCULAR | Status: DC | PRN
  Administered 2020-11-14: 25 ug via INTRAVENOUS
  Administered 2020-11-14: 50 ug via INTRAVENOUS
  Administered 2020-11-14: 100 ug via INTRAVENOUS
  Administered 2020-11-14: 25 ug via INTRAVENOUS

## 2020-11-14 MED ORDER — LABETALOL HCL 5 MG/ML IV SOLN
INTRAVENOUS | Status: DC | PRN
  Administered 2020-11-14: 5 mg via INTRAVENOUS

## 2020-11-14 MED ORDER — KETOROLAC TROMETHAMINE 30 MG/ML IJ SOLN
INTRAMUSCULAR | Status: DC | PRN
  Administered 2020-11-14: 30 mg via INTRAVENOUS

## 2020-11-14 MED ORDER — SCOPOLAMINE 1 MG/3DAYS TD PT72
1.0000 | MEDICATED_PATCH | TRANSDERMAL | Status: DC
  Administered 2020-11-14: 1.5 mg via TRANSDERMAL

## 2020-11-14 MED ORDER — PROPOFOL 10 MG/ML IV BOLUS
INTRAVENOUS | Status: AC
  Filled 2020-11-14: qty 40

## 2020-11-14 MED ORDER — ONDANSETRON HCL 4 MG/2ML IJ SOLN
INTRAMUSCULAR | Status: AC
  Filled 2020-11-14: qty 2

## 2020-11-14 MED ORDER — MIDAZOLAM HCL 5 MG/5ML IJ SOLN
INTRAMUSCULAR | Status: DC | PRN
  Administered 2020-11-14: 2 mg via INTRAVENOUS

## 2020-11-14 MED ORDER — LACTATED RINGERS IV SOLN: INTRAVENOUS | Status: DC

## 2020-11-14 MED ORDER — GLYCOPYRROLATE PF 0.2 MG/ML IJ SOSY
PREFILLED_SYRINGE | INTRAMUSCULAR | Status: AC
  Filled 2020-11-14: qty 1

## 2020-11-14 MED ORDER — DEXAMETHASONE SODIUM PHOSPHATE 4 MG/ML IJ SOLN
INTRAMUSCULAR | Status: DC | PRN
  Administered 2020-11-14: 5 mg via INTRAVENOUS

## 2020-11-14 MED ORDER — PROMETHAZINE HCL 25 MG/ML IJ SOLN: 6.2500 mg | INTRAMUSCULAR | Status: DC | PRN

## 2020-11-14 MED ORDER — FENTANYL CITRATE (PF) 100 MCG/2ML IJ SOLN: 25.0000 ug | INTRAMUSCULAR | Status: DC | PRN

## 2020-11-14 MED ORDER — KETOROLAC TROMETHAMINE 30 MG/ML IJ SOLN
INTRAMUSCULAR | Status: AC
  Filled 2020-11-14: qty 1

## 2020-11-14 MED ORDER — CEPHALEXIN 500 MG PO CAPS: 500.0000 mg | ORAL_CAPSULE | Freq: Every day | ORAL | 0 refills | Status: AC

## 2020-11-14 MED ORDER — LABETALOL HCL 5 MG/ML IV SOLN
INTRAVENOUS | Status: AC
  Filled 2020-11-14: qty 4

## 2020-11-14 MED ORDER — AMISULPRIDE (ANTIEMETIC) 5 MG/2ML IV SOLN
10.0000 mg | Freq: Once | INTRAVENOUS | Status: AC | PRN
  Administered 2020-11-14: 10 mg via INTRAVENOUS

## 2020-11-14 MED ORDER — MIDAZOLAM HCL 2 MG/2ML IJ SOLN
INTRAMUSCULAR | Status: AC
  Filled 2020-11-14: qty 2

## 2020-11-14 SURGICAL SUPPLY — 20 items
BAG DRAIN URO-CYSTO SKYTR STRL (DRAIN) ×3 IMPLANT
BAG DRN UROCATH (DRAIN) ×2
BASKET ZERO TIP NITINOL 2.4FR (BASKET) ×3 IMPLANT
BSKT STON RTRVL ZERO TP 2.4FR (BASKET) ×2
CATH URET 5FR 28IN OPEN ENDED (CATHETERS) ×3 IMPLANT
CLOTH BEACON ORANGE TIMEOUT ST (SAFETY) ×3 IMPLANT
DRSG TEGADERM 4X4.75 (GAUZE/BANDAGES/DRESSINGS) IMPLANT
EXTRACTOR STONE 1.7FRX115CM (UROLOGICAL SUPPLIES) IMPLANT
GLOVE SURG ENC MOIS LTX SZ6.5 (GLOVE) ×3 IMPLANT
GOWN STRL REUS W/TWL LRG LVL3 (GOWN DISPOSABLE) ×3 IMPLANT
GUIDEWIRE STR DUAL SENSOR (WIRE) ×6 IMPLANT
IV NS IRRIG 3000ML ARTHROMATIC (IV SOLUTION) ×3 IMPLANT
KIT TURNOVER CYSTO (KITS) ×3 IMPLANT
MANIFOLD NEPTUNE II (INSTRUMENTS) ×3 IMPLANT
PACK CYSTO (CUSTOM PROCEDURE TRAY) ×3 IMPLANT
STENT POLARIS 5FRX22 (STENTS) ×3 IMPLANT
TRACTIP FLEXIVA PULS ID 200XHI (Laser) ×2 IMPLANT
TRACTIP FLEXIVA PULSE ID 200 (Laser) ×3
TUBE CONNECTING 12X1/4 (SUCTIONS) ×3 IMPLANT
TUBING UROLOGY SET (TUBING) ×3 IMPLANT

## 2020-11-14 NOTE — Transfer of Care (Signed)
Immediate Anesthesia Transfer of Care Note  Patient: Cassidy Giles  Procedure(s) Performed: Procedure(s) (LRB): CYSTOSCOPY WITH RETROGRADE PYELOGRAM, URETEROSCOPY AND STENT PLACEMENT (Right) HOLMIUM LASER APPLICATION (Right)  Patient Location: PACU  Anesthesia Type: General  Level of Consciousness: awake, sedated, patient cooperative and responds to stimulation  Airway & Oxygen Therapy: Patient Spontanous Breathing and Patient connected to [place] 02 and soft FM   Post-op Assessment: Report given to PACU RN, Post -op Vital signs reviewed and stable and Patient moving all extremities  Post vital signs: Reviewed and stable  Complications: No apparent anesthesia complications

## 2020-11-14 NOTE — Discharge Instructions (Addendum)
DISCHARGE INSTRUCTIONS FOR KIDNEY STONE/URETERAL STENT   MEDICATIONS:  1. Resume all your other meds from home  2. AZO over the counter can help with the burning/stinging when you urinate. 3. Hydroconde-acetaminophen is for moderate/severe pain, otherwise taking up to 1000 mg every 6 hours of plain Tylenol (acetaminophen) will help treat your pain.   4. Take Cephalexin one hour prior to removal of your stent.  5.  Tamsulosin and oxybutynin can help with stent discomfort.   ACTIVITY:  1. No strenuous activity x 1week  2. No driving while on narcotic pain medications  3. Drink plenty of water  4. Continue to walk at home - you can still get blood clots when you are at home, so keep active, but don't over do it.  5. May return to work/school tomorrow or when you feel ready   BATHING:  1. You can shower and we recommend daily showers  2. You have a string coming from your urethra: The stent string is attached to your ureteral stent. Do not pull on this.   SIGNS/SYMPTOMS TO CALL:  Please call us if you have a fever greater than 101.5, uncontrolled nausea/vomiting, uncontrolled pain, dizziness, unable to urinate, bloody urine, chest pain, shortness of breath, leg swelling, leg pain, redness around wound, drainage from wound, or any other concerns or questions.   You can reach [place] at [phone].   FOLLOW-UP:  1. You have a string attached to your stent, you may remove it on Friday, July 22. To do this, pull the strings until the stent is completely removed. You may feel an odd sensation in your back.    Post Anesthesia Home Care Instructions  Activity: Get plenty of rest for the remainder of the day. A responsible individual must stay with you for 24 hours following the procedure.  For the next 24 hours, DO NOT: -Drive a car -Paediatric nurse -Drink alcoholic beverages -Take any medication unless instructed by your physician -Make any legal decisions or sign important  papers.  Meals: Start with liquid foods such as gelatin or soup. Progress to regular foods as tolerated. Avoid greasy, spicy, heavy foods. If nausea and/or vomiting occur, drink only clear liquids until the nausea and/or vomiting subsides. Call your physician if vomiting continues.  Special Instructions/Symptoms: Your throat may feel dry or sore from the anesthesia or the breathing tube placed in your throat during surgery. If this causes discomfort, gargle with warm salt water. The discomfort should disappear within 24 hours.  You had a scopolamine patch placed behind your ear for the management of post- operative nausea and/or vomiting:  1. The medication in the patch is effective for 72 hours, after which it should be removed.  Wrap patch in a tissue and discard in the trash. Wash hands thoroughly with soap and water. 2. You may remove the patch earlier than 72 hours if you experience unpleasant side effects which may include dry mouth, dizziness or visual disturbances. 3. Avoid touching the patch. Wash your hands with soap and water after contact with the patch.      **No acetaminophen or Tylenol until after 1:00pm today if needed.  **No ibuprofen, Advil, Aleve, Motrin, ketorolac, meloxicam, or naproxen until after 2:30pm today if needed.

## 2020-11-14 NOTE — Anesthesia Postprocedure Evaluation (Signed)
Anesthesia Post Note  Patient: Cassidy Giles  Procedure(s) Performed: CYSTOSCOPY WITH RETROGRADE PYELOGRAM, URETEROSCOPY AND STENT PLACEMENT (Right: Ureter) HOLMIUM LASER APPLICATION (Right)     Patient location during evaluation: PACU Anesthesia Type: General Level of consciousness: sedated Pain management: pain level controlled Vital Signs Assessment: post-procedure vital signs reviewed and stable Respiratory status: spontaneous breathing and respiratory function stable Cardiovascular status: stable Postop Assessment: no apparent nausea or vomiting Anesthetic complications: no   No notable events documented.  Last Vitals:  Vitals:   11/14/20 0845 11/14/20 0915  BP: (!) 141/65 (!) 171/81  Pulse: 75 72  Resp: 18 16  Temp:  (!) 36.3 C  SpO2: 99% 100%    Last Pain:  Vitals:   11/14/20 0940  TempSrc:   PainSc: 0-No pain                 Arvada Seaborn DANIEL

## 2020-11-14 NOTE — Op Note (Signed)
Preoperative diagnosis: right renal calculus  Postoperative diagnosis: right renal calculus  Procedure:  Cystoscopy right ureteroscopy, laser lithotripsy, basket stone extraction right 71F x 22 ureteral stent placement with tether right retrograde pyelography with interpretation  Surgeon: Jacalyn Lefevre, MD  Anesthesia: General  Complications: None  Intraoperative findings:  Normal urethra Bilateral orthotropic ureteral orifices right retrograde pyelography demonstrated normal ureter without filling defects; know pelvic kidney confirmed Bladder mucosa normal without masses   EBL: Minimal  Specimens: right renal calculus  Disposition of specimens: [facility] for stone analysis  Indication: Cassidy Giles is a 55 y.o.   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 After reviewing the management options for treatment, the patient elected to proceed with the above surgical procedure(s). We have discussed the potential benefits and risks of the procedure, side effects of the proposed treatment, the likelihood of the patient achieving the goals of the procedure, and any potential problems that might occur during the procedure or recuperation. Informed consent has been obtained.   Description of procedure:  The patient was taken to the operating room and general anesthesia was induced.  The patient was placed in the dorsal lithotomy position, prepped and draped in the usual sterile fashion, and preoperative antibiotics were administered. A preoperative time-out was performed.   Cystourethroscopy was performed.  The patient's urethra was examined and was normal. he bladder was then systematically examined in its entirety. There was no evidence for any bladder tumors, stones, or other mucosal pathology.    Attention then turned to the right ureteral orifice and a ureteral catheter was used to intubate the ureteral orifice.   Omnipaque contrast was injected through the ureteral catheter and a retrograde pyelogram was performed with findings as dictated above.  A 0.38 sensor guidewire was then advanced up the right ureter into the renal pelvis under fluoroscopic guidance. The 5 Fr semirigid ureteroscope was then advanced into the ureter next to the guidewire to the level  of UPJ.   The ureteroscope was removed.  A flexible ureteroscope was then advanced alongside the wire up to the renal pelvis.  The 7 mm known renal calculus was seen in the lower pole. The stone was then fragmented with the 200 micron holmium laser fiber.  A small stone fragment was removed were then removed from the lower pole with a 0 tip basket to be sent for analysis.  All remaining stone fragments were less than 1 mm in size.  Reinspection of the ureter revealed no remaining visible stones or fragments.   The wire was then backloaded through the cystoscope and a ureteral stent was advance over the wire using Seldinger technique.  The stent was positioned appropriately under fluoroscopic and cystoscopic guidance.  The wire was then removed with an adequate stent curl noted in the renal pelvis as well as in the bladder.  The bladder was then emptied and the procedure ended.  The patient appeared to tolerate the procedure well and without complications.  The patient was able to be awakened and transferred to the recovery unit in satisfactory condition.   Disposition: The tether of the stent was left on and tucked inside the patient's vagina.  Instructions for removing the stent have been provided to the patient.

## 2020-11-14 NOTE — Anesthesia Procedure Notes (Signed)
Procedure Name: LMA Insertion Date/Time: 11/14/2020 7:49 AM Performed by: Justice Rocher, CRNA Pre-anesthesia Checklist: Patient identified, Emergency Drugs available, Suction available, Patient being monitored and Timeout performed Patient Re-evaluated:Patient Re-evaluated prior to induction Oxygen Delivery Method: Circle system utilized Preoxygenation: Pre-oxygenation with 100% oxygen Induction Type: IV induction Ventilation: Mask ventilation without difficulty LMA: LMA inserted LMA Size: 4.0 Number of attempts: 1 Airway Equipment and Method: Bite block Placement Confirmation: positive ETCO2, breath sounds checked- equal and bilateral and CO2 detector Tube secured with: Tape Dental Injury: Teeth and Oropharynx as per pre-operative assessment  Comments: Positioned - shoulders ramped with blanket grey shoulder support and headrest

## 2020-11-14 NOTE — Interval H&P Note (Signed)
History and Physical Interval Note: I discussed the below procedure with the patient in detail.  I specifically counseled patient that this may sometimes be a staged procedure and she may need a stent for a few weeks prior to being able to access the stone.  She understands that stents may be uncomfortable and cause pain, overactive bladder, blood in the urine and other symptoms.  Risks and benefits of ureteroscopy in general also discussed with the patient.  She has elected to proceed.  11/14/2020 7:19 AM  Cassidy Giles  has presented today for surgery, with the diagnosis of RIGHT RENAL CALCULUS.  The various methods of treatment have been discussed with the patient and family. After consideration of risks, benefits and other options for treatment, the patient has consented to  Procedure(s) with comments: CYSTOSCOPY WITH RETROGRADE PYELOGRAM, URETEROSCOPY AND STENT PLACEMENT (Right) - 1 HR HOLMIUM LASER APPLICATION (Right) as a surgical intervention.  The patient's history has been reviewed, patient examined, no change in status, stable for surgery.  I have reviewed the patient's chart and labs.  Questions were answered to the patient's satisfaction.     Bert Givans D Jacobus Colvin

## 2020-11-15 ENCOUNTER — Encounter (HOSPITAL_BASED_OUTPATIENT_CLINIC_OR_DEPARTMENT_OTHER): Payer: Self-pay | Admitting: Urology

## 2021-12-05 ENCOUNTER — Encounter (INDEPENDENT_AMBULATORY_CARE_PROVIDER_SITE_OTHER): Payer: Self-pay

## 2023-03-14 IMAGING — CT CT ABD-PELV W/ CM
2 of 5 series · 16 of 46 positions shown, 18 images · IV contrast (Omnipaque)
Comparison: Noncontrast CT from earlier today

CLINICAL DATA: Follow-up abnormal CT.  Right lower quadrant pain

EXAM:
CT ABDOMEN AND PELVIS WITH CONTRAST
TECHNIQUE: Multidetector CT imaging of the abdomen and pelvis was performed
using the standard protocol following bolus administration of
intravenous contrast.
CONTRAST:  100mL OMNIPAQUE IOHEXOL 300 MG/ML  SOLN

[Series 2: axial st · axial · 0.90mm/px · z∈[+690,+1104]mm · 13 of 95 slices shown, 15 images]
[im 6/95  soft-tissue]
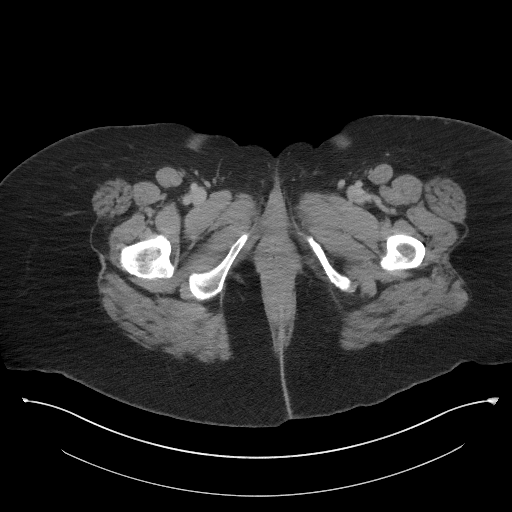
[im 6/95  bone]
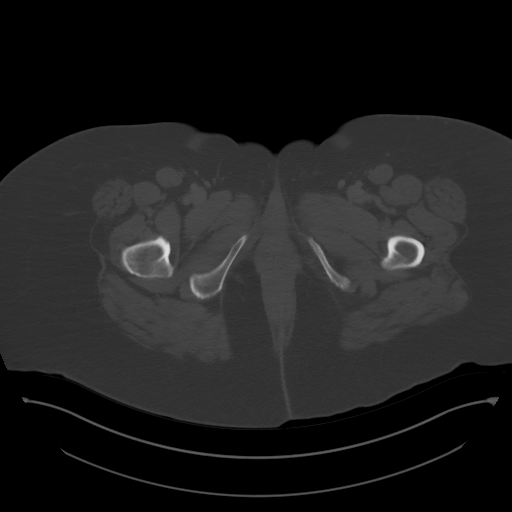
[im 12/95  soft-tissue]
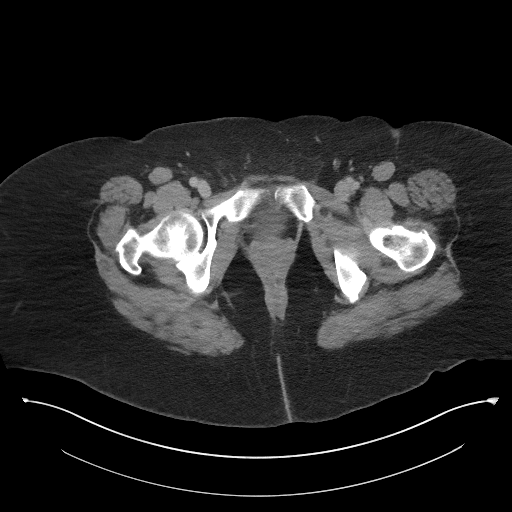
[im 18/95  soft-tissue]
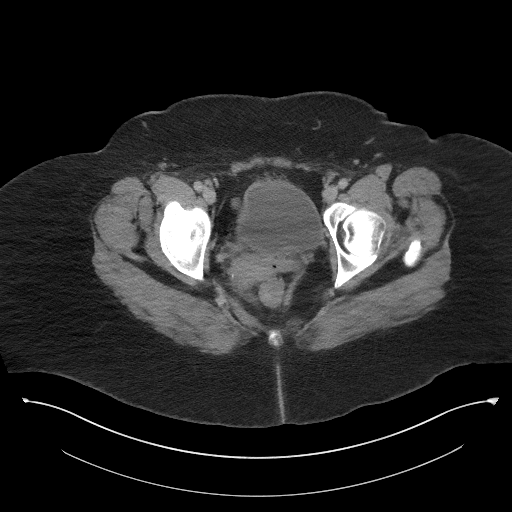
[im 30/95  soft-tissue]
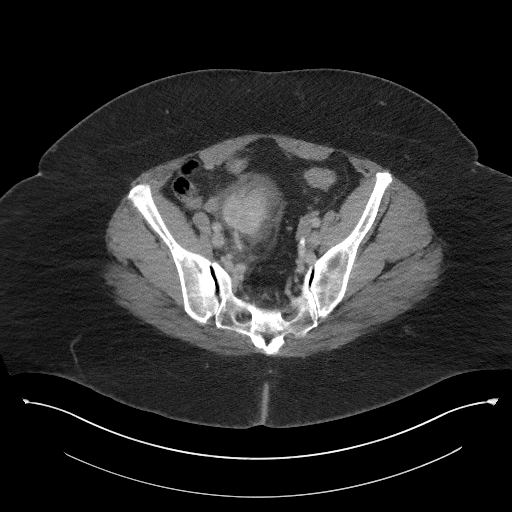
[im 36/95  soft-tissue]
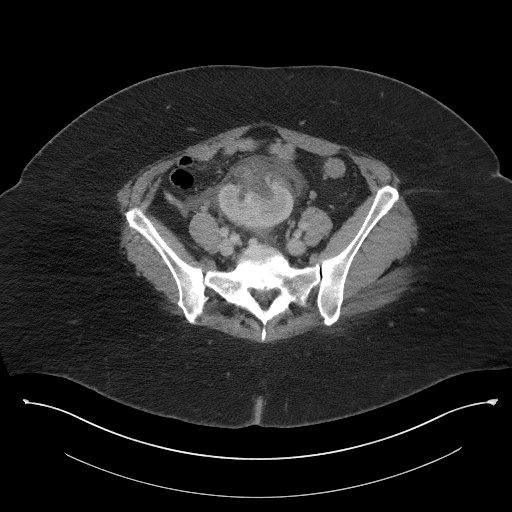
[im 42/95  soft-tissue]
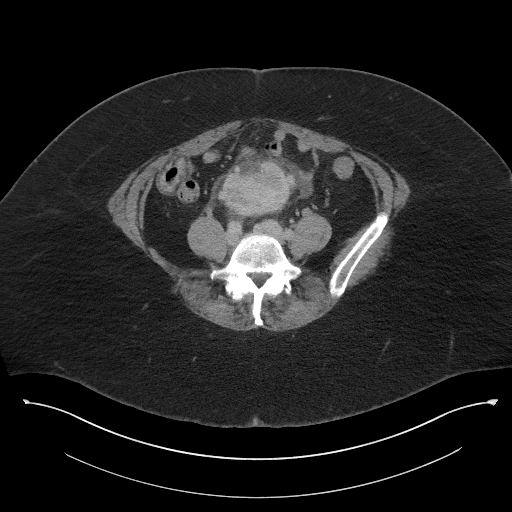
[im 48/95  soft-tissue]
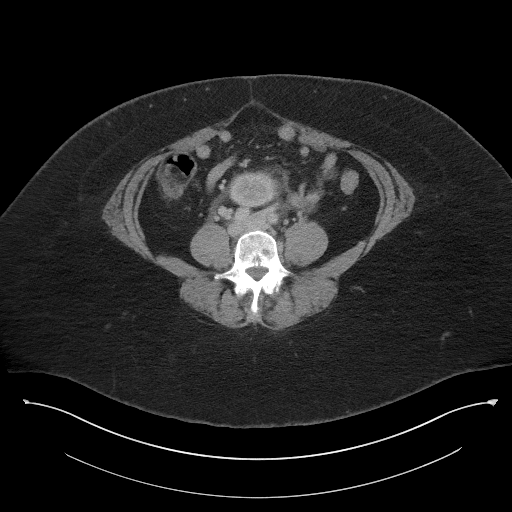
[im 53/95  soft-tissue]
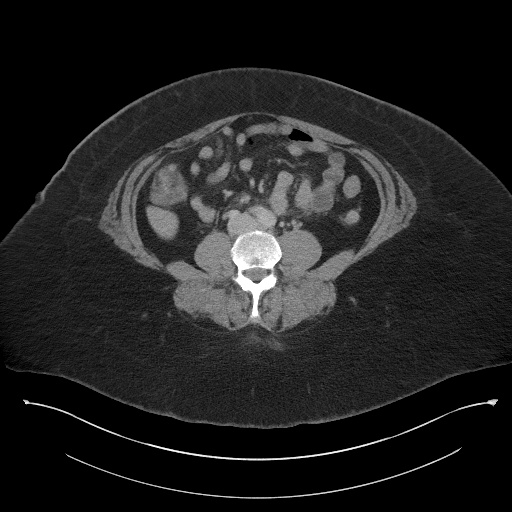
[im 59/95  soft-tissue]
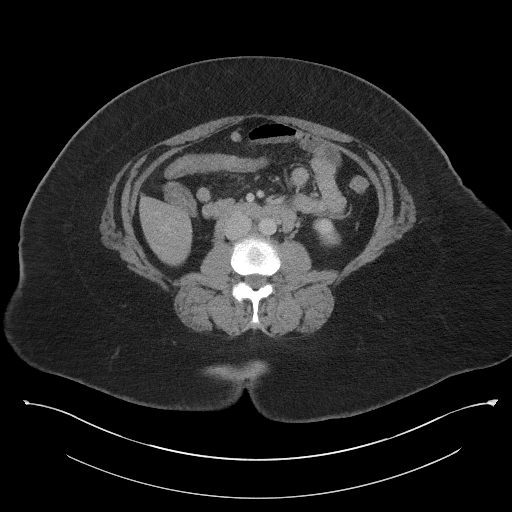
[im 59/95  bone]
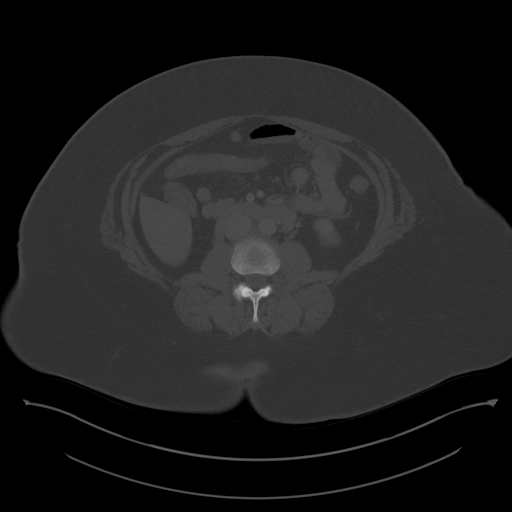
[im 65/95  soft-tissue]
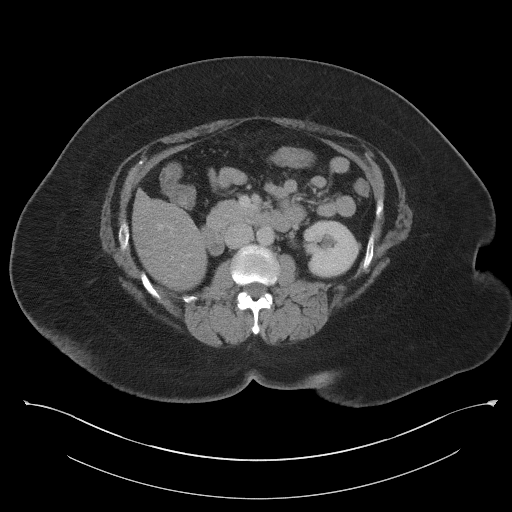
[im 77/95  soft-tissue]
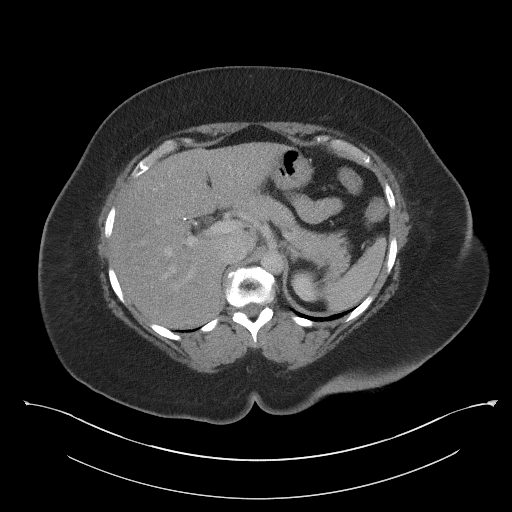
[im 83/95  soft-tissue]
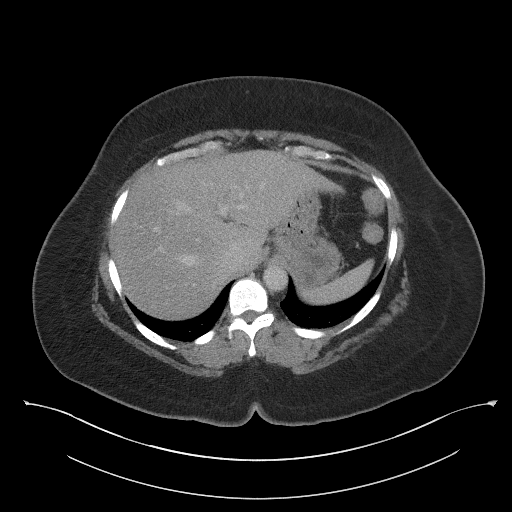
[im 89/95  soft-tissue]
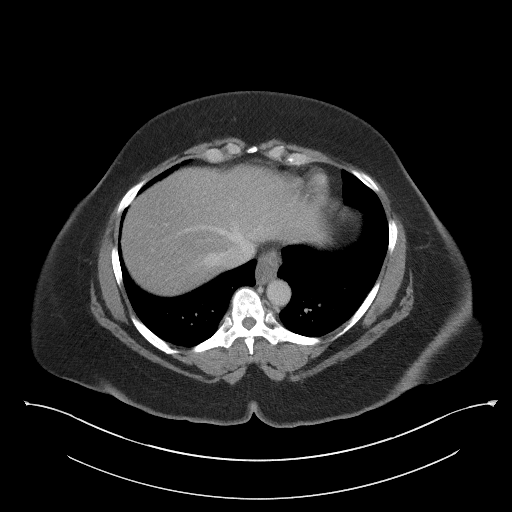

[Series 5: coronal st · coronal · 0.73mm/px · 3 of 90 slices shown]
[im 30/90  soft-tissue]
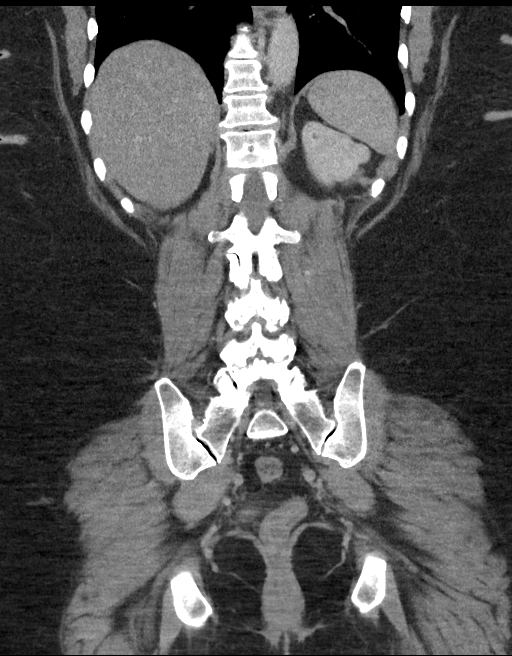
[im 40/90  soft-tissue]
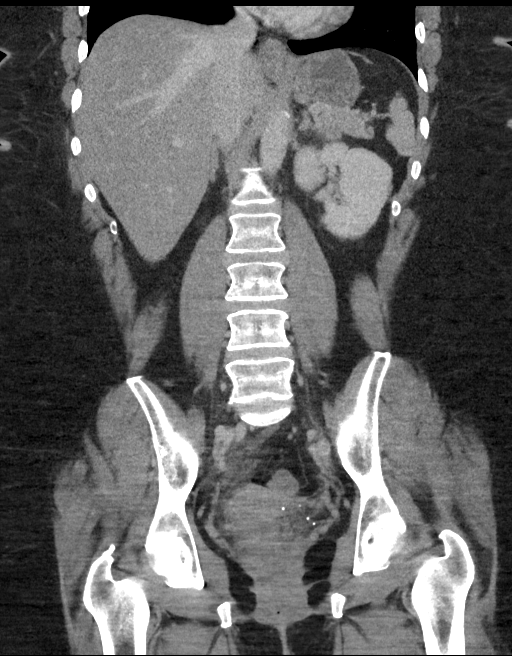
[im 50/90  soft-tissue]
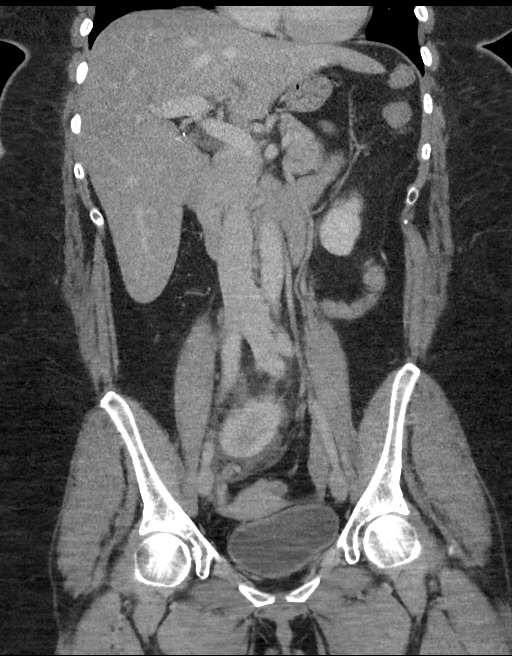

[16 of 46 positions shown; findings below may reference images not displayed]

FINDINGS: Lower chest:  Tiny sliding hiatal hernia.

Hepatobiliary: Hepatic steatosis.Cholecystectomy. No bile duct
dilatation.

Pancreas: Unremarkable.

Spleen: Unremarkable.

Adrenals/Urinary Tract: Negative adrenals. Right pelvic kidney with
diffuse delayed renal enhancement and marked perinephric stranding.
The cortex is homogeneously enhancing for the contrast phase, not
typical of pyelonephritis. There is mild hydronephrosis and
hydroureter but no visible obstructing stone. 8 mm right renal
calculus in stable position from prior. The renal arteries and veins
are ectopic but enhancing. Negative left kidney. Negative urinary
bladder.

Stomach/Bowel: No obstruction. No appendicitis. Left colonic
diverticulosis.

Vascular/Lymphatic: No acute vascular abnormality. No mass or
adenopathy.

Reproductive:Exophytic uterine fibroid extending ventrally and
measuring 2 cm

Other: No ascites or pneumoperitoneum.

Musculoskeletal: No acute abnormalities. Lower thoracic disc and
lower lumbar facet degeneration. Sacroiliac degeneration and vacuum
phenomenon.
IMPRESSION: 1. Marked perinephric edema of the pelvic kidney which shows
hydroureteronephrosis and delayed renal enhancement but no visible
obstructing cause. Question ascending infection or hematuria.
2. 8 mm right renal calculus.
3. Hepatic steatosis.
